# Patient Record
Sex: Female | Born: 1975 | Race: White | Hispanic: No | Marital: Married | Smoking: Never smoker
Health system: Southern US, Community
[De-identification: ages and names within clinical notes are randomized; demographics above are authoritative.]

## PROBLEM LIST (undated history)

## (undated) DIAGNOSIS — E079 Disorder of thyroid, unspecified: Secondary | ICD-10-CM

## (undated) DIAGNOSIS — T7840XA Allergy, unspecified, initial encounter: Secondary | ICD-10-CM

## (undated) DIAGNOSIS — C801 Malignant (primary) neoplasm, unspecified: Secondary | ICD-10-CM

## (undated) HISTORY — PX: KNEE ARTHROSCOPY: SHX127

## (undated) HISTORY — DX: Allergy, unspecified, initial encounter: T78.40XA

## (undated) HISTORY — PX: WISDOM TOOTH EXTRACTION: SHX21

## (undated) HISTORY — PX: DILATION AND CURETTAGE OF UTERUS: SHX78

## (undated) HISTORY — DX: Disorder of thyroid, unspecified: E07.9

---

## 2005-04-14 ENCOUNTER — Ambulatory Visit: Payer: Self-pay | Admitting: Obstetrics and Gynecology

## 2006-04-27 ENCOUNTER — Inpatient Hospital Stay: Payer: Self-pay

## 2008-09-02 ENCOUNTER — Inpatient Hospital Stay: Payer: Self-pay | Admitting: Obstetrics and Gynecology

## 2009-03-06 ENCOUNTER — Inpatient Hospital Stay: Payer: Self-pay | Admitting: Obstetrics and Gynecology

## 2014-06-09 HISTORY — PX: MELANOMA EXCISION: SHX5266

## 2014-10-11 HISTORY — PX: INTRAUTERINE DEVICE (IUD) INSERTION: SHX5877

## 2015-09-09 ENCOUNTER — Encounter: Payer: Self-pay | Admitting: Unknown Physician Specialty

## 2015-09-09 ENCOUNTER — Ambulatory Visit (INDEPENDENT_AMBULATORY_CARE_PROVIDER_SITE_OTHER): Payer: BC Managed Care – PPO | Admitting: Unknown Physician Specialty

## 2015-09-09 VITALS — BP 118/82 | HR 65 | Temp 98.6°F | Ht 66.2 in | Wt 119.6 lb

## 2015-09-09 DIAGNOSIS — Z0000 Encounter for general adult medical examination without abnormal findings: Secondary | ICD-10-CM | POA: Diagnosis not present

## 2015-09-09 DIAGNOSIS — J312 Chronic pharyngitis: Secondary | ICD-10-CM

## 2015-09-09 NOTE — Progress Notes (Signed)
   BP 118/82 mmHg  Pulse 65  Temp(Src) 98.6 F (37 C)  Ht 5' 6.2" (1.681 m)  Wt 119 lb 9.6 oz (54.25 kg)  BMI 19.20 kg/m2  SpO2 99%  LMP  (LMP Unknown)   Subjective:    Patient ID: Tonya Estrada, female    DOB: 03-Jun-1976, 39 y.o.   MRN: [id number]  HPI: Tonya Estrada is a 39 y.o. female  Chief Complaint  Patient presents with  . Establish Care    pt is re-establishing care   Pt originally made the appointment for pain in her throat.  States it is better.  States it is on the right side and is there for about 2 months.  She is up to date on pap smears.    Relevant past medical, surgical, family and social history reviewed and updated as indicated. Interim medical history since our last visit reviewed. Allergies and medications reviewed and updated.  Review of Systems  Per HPI unless specifically indicated above     Objective:    BP 118/82 mmHg  Pulse 65  Temp(Src) 98.6 F (37 C)  Ht 5' 6.2" (1.681 m)  Wt 119 lb 9.6 oz (54.25 kg)  BMI 19.20 kg/m2  SpO2 99%  LMP  (LMP Unknown)  Wt Readings from Last 3 Encounters:  09/09/15 119 lb 9.6 oz (54.25 kg)  02/18/11 118 lb (53.524 kg)    Physical Exam  Constitutional: She is oriented to person, place, and time. She appears well-developed and well-nourished.  HENT:  Head: Normocephalic and atraumatic.  Eyes: Pupils are equal, round, and reactive to light. Right eye exhibits no discharge. Left eye exhibits no discharge. No scleral icterus.  Neck: Normal range of motion. Neck supple. Carotid bruit is not present. No thyromegaly present.  Cardiovascular: Normal rate, regular rhythm and normal heart sounds.  Exam reveals no gallop and no friction rub.   No murmur heard. Pulmonary/Chest: Effort normal and breath sounds normal. No respiratory distress. She has no wheezes. She has no rales.  Abdominal: Soft. Bowel sounds are normal. There is no tenderness. There is no rebound.  Genitourinary: No breast swelling, tenderness  or discharge.  Musculoskeletal: Normal range of motion.  Lymphadenopathy:    She has no cervical adenopathy.  Neurological: She is alert and oriented to person, place, and time.  Skin: Skin is warm, dry and intact. No rash noted.  Psychiatric: She has a normal mood and affect. Her speech is normal and behavior is normal. Judgment and thought content normal. Cognition and memory are normal.    No results found for this or any previous visit.    Assessment & Plan:   Problem List Items Addressed This Visit    None    Visit Diagnoses    Routine general medical examination at a health care facility    -  Primary    Relevant Medications    Multiple Vitamin (MULTIVITAMIN) tablet    Other Relevant Orders    HIV antibody    Comprehensive metabolic panel    CBC with Differential/Platelet    TSH    Lipid Panel w/o Chol/HDL Ratio    Flu Vaccine QUAD 36+ mos IM    Chronic sore throat        Relevant Orders    Ambulatory referral to ENT        Follow up plan: Return for lab results.

## 2015-09-10 ENCOUNTER — Encounter: Payer: Self-pay | Admitting: Unknown Physician Specialty

## 2015-09-10 LAB — COMPREHENSIVE METABOLIC PANEL
A/G RATIO: 1.6 (ref 1.1–2.5)
ALBUMIN: 4.7 g/dL (ref 3.5–5.5)
ALT: 18 IU/L (ref 0–32)
AST: 19 IU/L (ref 0–40)
Alkaline Phosphatase: 56 IU/L (ref 39–117)
BILIRUBIN TOTAL: 0.3 mg/dL (ref 0.0–1.2)
BUN / CREAT RATIO: 31 — AB (ref 8–20)
BUN: 19 mg/dL (ref 6–20)
CHLORIDE: 100 mmol/L (ref 97–106)
CO2: 22 mmol/L (ref 18–29)
Calcium: 9.4 mg/dL (ref 8.7–10.2)
Creatinine, Ser: 0.62 mg/dL (ref 0.57–1.00)
GFR calc non Af Amer: 115 mL/min/{1.73_m2} (ref >59)
GFR, EST AFRICAN AMERICAN: 132 mL/min/{1.73_m2} (ref >59)
Globulin, Total: 2.9 g/dL (ref 1.5–4.5)
Glucose: 89 mg/dL (ref 65–99)
Potassium: 4.3 mmol/L (ref 3.5–5.2)
Sodium: 140 mmol/L (ref 136–144)
TOTAL PROTEIN: 7.6 g/dL (ref 6.0–8.5)

## 2015-09-10 LAB — LIPID PANEL W/O CHOL/HDL RATIO
CHOLESTEROL TOTAL: 140 mg/dL (ref 100–199)
HDL: 52 mg/dL (ref >39)
LDL Calculated: 72 mg/dL (ref 0–99)
TRIGLYCERIDES: 82 mg/dL (ref 0–149)
VLDL Cholesterol Cal: 16 mg/dL (ref 5–40)

## 2015-09-10 LAB — CBC WITH DIFFERENTIAL/PLATELET
Basophils Absolute: 0 10*3/uL (ref 0.0–0.2)
Basos: 1 %
EOS (ABSOLUTE): 0.1 10*3/uL (ref 0.0–0.4)
EOS: 2 %
Hematocrit: 38.2 % (ref 34.0–46.6)
Hemoglobin: 12.6 g/dL (ref 11.1–15.9)
IMMATURE GRANS (ABS): 0 10*3/uL (ref 0.0–0.1)
IMMATURE GRANULOCYTES: 0 %
LYMPHS: 21 %
Lymphocytes Absolute: 1.5 10*3/uL (ref 0.7–3.1)
MCH: 29.5 pg (ref 26.6–33.0)
MCHC: 33 g/dL (ref 31.5–35.7)
MCV: 90 fL (ref 79–97)
MONOS ABS: 0.5 10*3/uL (ref 0.1–0.9)
Monocytes: 7 %
Neutrophils Absolute: 4.9 10*3/uL (ref 1.4–7.0)
Neutrophils: 69 %
PLATELETS: 236 10*3/uL (ref 150–379)
RBC: 4.27 x10E6/uL (ref 3.77–5.28)
RDW: 12.8 % (ref 12.3–15.4)
WBC: 7.1 10*3/uL (ref 3.4–10.8)

## 2015-09-10 LAB — HIV ANTIBODY (ROUTINE TESTING W REFLEX): HIV Screen 4th Generation wRfx: NONREACTIVE

## 2015-09-10 LAB — TSH: TSH: 1.29 u[IU]/mL (ref 0.450–4.500)

## 2015-09-10 NOTE — Progress Notes (Signed)
Quick Note:  Normal labs. Patient notified by letter. ______ 

## 2017-01-12 ENCOUNTER — Encounter: Payer: BC Managed Care – PPO | Admitting: Unknown Physician Specialty

## 2017-01-13 ENCOUNTER — Encounter: Payer: BC Managed Care – PPO | Admitting: Unknown Physician Specialty

## 2017-02-08 ENCOUNTER — Encounter: Payer: Self-pay | Admitting: Unknown Physician Specialty

## 2017-02-08 ENCOUNTER — Ambulatory Visit (INDEPENDENT_AMBULATORY_CARE_PROVIDER_SITE_OTHER): Payer: BC Managed Care – PPO | Admitting: Unknown Physician Specialty

## 2017-02-08 VITALS — BP 117/80 | HR 67 | Temp 98.3°F | Ht 66.3 in | Wt 120.7 lb

## 2017-02-08 DIAGNOSIS — Z0000 Encounter for general adult medical examination without abnormal findings: Secondary | ICD-10-CM | POA: Diagnosis not present

## 2017-02-08 NOTE — Progress Notes (Signed)
BP 117/80 (BP Location: Left Arm, Patient Position: Sitting, Cuff Size: Small)   Pulse 67   Temp 98.3 F (36.8 C)   Ht 5' 6.3" (1.684 m)   Wt 120 lb 11.2 oz (54.7 kg)   SpO2 99%   BMI 19.31 kg/m    Subjective:    Patient ID: Tonya Estrada, female    DOB: Oct 13, 1976, 41 y.o.   MRN: [id number]  HPI: Tonya Estrada is a 41 y.o. female  Chief Complaint  Patient presents with  . Annual Exam    pt states she goes to OBGYN for pap smears   Social History   Social History  . Marital status: Married    Spouse name: N/A  . Number of children: N/A  . Years of education: N/A   Occupational History  . Not on file.   Social History Main Topics  . Smoking status: Never Smoker  . Smokeless tobacco: Never Used  . Alcohol use 1.2 oz/week    2 Glasses of wine per week  . Drug use: No  . Sexual activity: Yes    Birth control/ protection: IUD   Other Topics Concern  . Not on file   Social History Narrative  . No narrative on file   Family History  Problem Relation Age of Onset  . Arthritis Mother     RA  . Multiple myeloma Maternal Grandmother   . Heart disease Maternal Grandmother     MI  . Diabetes Maternal Grandfather     type 1  . Bipolar disorder Paternal Grandmother     thinks bipolar came from head injuries  . Cancer Paternal Grandfather     brain   Past Medical History:  Diagnosis Date  . Allergy    Past Surgical History:  Procedure Laterality Date  . KNEE ARTHROSCOPY      Relevant past medical, surgical, family and social history reviewed and updated as indicated. Interim medical history since our last visit reviewed. Allergies and medications reviewed and updated.  Review of Systems  Per HPI unless specifically indicated above     Objective:    BP 117/80 (BP Location: Left Arm, Patient Position: Sitting, Cuff Size: Small)   Pulse 67   Temp 98.3 F (36.8 C)   Ht 5' 6.3" (1.684 m)   Wt 120 lb 11.2 oz (54.7 kg)   SpO2 99%   BMI 19.31  kg/m   Wt Readings from Last 3 Encounters:  02/08/17 120 lb 11.2 oz (54.7 kg)  09/09/15 119 lb 9.6 oz (54.3 kg)  02/18/11 118 lb (53.5 kg)    Physical Exam  Constitutional: She is oriented to person, place, and time. She appears well-developed and well-nourished.  HENT:  Head: Normocephalic and atraumatic.  Eyes: Pupils are equal, round, and reactive to light. Right eye exhibits no discharge. Left eye exhibits no discharge. No scleral icterus.  Neck: Normal range of motion. Neck supple. Carotid bruit is not present. No thyromegaly present.  Cardiovascular: Normal rate, regular rhythm and normal heart sounds.  Exam reveals no gallop and no friction rub.   No murmur heard. Pulmonary/Chest: Effort normal and breath sounds normal. No respiratory distress. She has no wheezes. She has no rales.  Abdominal: Soft. Bowel sounds are normal. There is no tenderness. There is no rebound.  Genitourinary: No breast swelling, tenderness or discharge.  Musculoskeletal: Normal range of motion.  Lymphadenopathy:    She has no cervical adenopathy.  Neurological: She is alert and oriented  to person, place, and time.  Skin: Skin is warm, dry and intact. No rash noted.  Psychiatric: She has a normal mood and affect. Her speech is normal and behavior is normal. Judgment and thought content normal. Cognition and memory are normal.    Results for orders placed or performed in visit on 09/09/15  HIV antibody  Result Value Ref Range   HIV Screen 4th Generation wRfx Non Reactive Non Reactive  Comprehensive metabolic panel  Result Value Ref Range   Glucose 89 65 - 99 mg/dL   BUN 19 6 - 20 mg/dL   Creatinine, Ser 0.62 0.57 - 1.00 mg/dL   GFR calc non Af Amer 115 >59 mL/min/1.73   GFR calc Af Amer 132 >59 mL/min/1.73   BUN/Creatinine Ratio 31 (H) 8 - 20   Sodium 140 136 - 144 mmol/L   Potassium 4.3 3.5 - 5.2 mmol/L   Chloride 100 97 - 106 mmol/L   CO2 22 18 - 29 mmol/L   Calcium 9.4 8.7 - 10.2 mg/dL    Total Protein 7.6 6.0 - 8.5 g/dL   Albumin 4.7 3.5 - 5.5 g/dL   Globulin, Total 2.9 1.5 - 4.5 g/dL   Albumin/Globulin Ratio 1.6 1.1 - 2.5   Bilirubin Total 0.3 0.0 - 1.2 mg/dL   Alkaline Phosphatase 56 39 - 117 IU/L   AST 19 0 - 40 IU/L   ALT 18 0 - 32 IU/L  CBC with Differential/Platelet  Result Value Ref Range   WBC 7.1 3.4 - 10.8 x10E3/uL   RBC 4.27 3.77 - 5.28 x10E6/uL   Hemoglobin 12.6 11.1 - 15.9 g/dL   Hematocrit 38.2 34.0 - 46.6 %   MCV 90 79 - 97 fL   MCH 29.5 26.6 - 33.0 pg   MCHC 33.0 31.5 - 35.7 g/dL   RDW 12.8 12.3 - 15.4 %   Platelets 236 150 - 379 x10E3/uL   Neutrophils 69 %   Lymphs 21 %   Monocytes 7 %   Eos 2 %   Basos 1 %   Neutrophils Absolute 4.9 1.4 - 7.0 x10E3/uL   Lymphocytes Absolute 1.5 0.7 - 3.1 x10E3/uL   Monocytes Absolute 0.5 0.1 - 0.9 x10E3/uL   EOS (ABSOLUTE) 0.1 0.0 - 0.4 x10E3/uL   Basophils Absolute 0.0 0.0 - 0.2 x10E3/uL   Immature Granulocytes 0 %   Immature Grans (Abs) 0.0 0.0 - 0.1 x10E3/uL  TSH  Result Value Ref Range   TSH 1.290 0.450 - 4.500 uIU/mL  Lipid Panel w/o Chol/HDL Ratio  Result Value Ref Range   Cholesterol, Total 140 100 - 199 mg/dL   Triglycerides 82 0 - 149 mg/dL   HDL 52 >39 mg/dL   VLDL Cholesterol Cal 16 5 - 40 mg/dL   LDL Calculated 72 0 - 99 mg/dL      Assessment & Plan:   Problem List Items Addressed This Visit    None    Visit Diagnoses    Annual physical exam    -  Primary      No labs with excellent labs last visit Follow up plan: Return in about 1 year (around 02/08/2018), or if symptoms worsen or fail to improve.

## 2017-05-24 ENCOUNTER — Encounter: Payer: Self-pay | Admitting: Unknown Physician Specialty

## 2017-05-24 ENCOUNTER — Ambulatory Visit (INDEPENDENT_AMBULATORY_CARE_PROVIDER_SITE_OTHER): Payer: BC Managed Care – PPO | Admitting: Unknown Physician Specialty

## 2017-05-24 DIAGNOSIS — L249 Irritant contact dermatitis, unspecified cause: Secondary | ICD-10-CM

## 2017-05-24 DIAGNOSIS — L259 Unspecified contact dermatitis, unspecified cause: Secondary | ICD-10-CM | POA: Insufficient documentation

## 2017-05-24 MED ORDER — MOMETASONE FUROATE 0.1 % EX CREA: 1.0000 "application " | TOPICAL_CREAM | Freq: Every day | CUTANEOUS | 0 refills | Status: DC

## 2017-05-24 MED ORDER — PREDNISONE 10 MG PO TABS: ORAL_TABLET | ORAL | 0 refills | Status: DC

## 2017-05-24 NOTE — Progress Notes (Signed)
BP 128/89   Pulse 64   Temp 99 F (37.2 C)   Wt 122 lb 9.6 oz (55.6 kg)   SpO2 99%   BMI 19.61 kg/m    Subjective:    Patient ID: Tonya Estrada, female    DOB: 1976-11-08, 41 y.o.   MRN: [id number]  HPI: Tonya Estrada is a 41 y.o. female  Chief Complaint  Patient presents with  . Rash    pt states she has a rash just on her face and ears that came up Thursday, states it does itch   Rash Pt with a rash that started on Thursday.  It itches.. Does not know what she came in contact with. No new soaps or creams.  No SOB.  Tends to be sensitive to plans  Relevant past medical, surgical, family and social history reviewed and updated as indicated. Interim medical history since our last visit reviewed. Allergies and medications reviewed and updated.  Review of Systems  Per HPI unless specifically indicated above     Objective:    BP 128/89   Pulse 64   Temp 99 F (37.2 C)   Wt 122 lb 9.6 oz (55.6 kg)   SpO2 99%   BMI 19.61 kg/m   Wt Readings from Last 3 Encounters:  05/24/17 122 lb 9.6 oz (55.6 kg)  02/08/17 120 lb 11.2 oz (54.7 kg)  09/09/15 119 lb 9.6 oz (54.3 kg)    Physical Exam  Constitutional: She is oriented to person, place, and time. She appears well-developed and well-nourished. No distress.  HENT:  Head: Normocephalic and atraumatic.  Eyes: Conjunctivae and lids are normal. Right eye exhibits no discharge. Left eye exhibits no discharge. No scleral icterus.  Neck: Normal range of motion. Neck supple. No JVD present. Carotid bruit is not present.  Pulmonary/Chest: Effort normal and breath sounds normal.  Abdominal: Normal appearance. There is no splenomegaly or hepatomegaly.  Musculoskeletal: Normal range of motion.  Neurological: She is alert and oriented to person, place, and time.  Skin: Skin is warm, dry and intact. No pallor.  Pt with a erythemetous papular rash orver face and ears  Psychiatric: She has a normal mood and affect. Her behavior  is normal. Judgment and thought content normal.    Results for orders placed or performed in visit on 09/09/15  HIV antibody  Result Value Ref Range   HIV Screen 4th Generation wRfx Non Reactive Non Reactive  Comprehensive metabolic panel  Result Value Ref Range   Glucose 89 65 - 99 mg/dL   BUN 19 6 - 20 mg/dL   Creatinine, Ser 0.62 0.57 - 1.00 mg/dL   GFR calc non Af Amer 115 >59 mL/min/1.73   GFR calc Af Amer 132 >59 mL/min/1.73   BUN/Creatinine Ratio 31 (H) 8 - 20   Sodium 140 136 - 144 mmol/L   Potassium 4.3 3.5 - 5.2 mmol/L   Chloride 100 97 - 106 mmol/L   CO2 22 18 - 29 mmol/L   Calcium 9.4 8.7 - 10.2 mg/dL   Total Protein 7.6 6.0 - 8.5 g/dL   Albumin 4.7 3.5 - 5.5 g/dL   Globulin, Total 2.9 1.5 - 4.5 g/dL   Albumin/Globulin Ratio 1.6 1.1 - 2.5   Bilirubin Total 0.3 0.0 - 1.2 mg/dL   Alkaline Phosphatase 56 39 - 117 IU/L   AST 19 0 - 40 IU/L   ALT 18 0 - 32 IU/L  CBC with Differential/Platelet  Result Value Ref Range  WBC 7.1 3.4 - 10.8 x10E3/uL   RBC 4.27 3.77 - 5.28 x10E6/uL   Hemoglobin 12.6 11.1 - 15.9 g/dL   Hematocrit 38.2 34.0 - 46.6 %   MCV 90 79 - 97 fL   MCH 29.5 26.6 - 33.0 pg   MCHC 33.0 31.5 - 35.7 g/dL   RDW 12.8 12.3 - 15.4 %   Platelets 236 150 - 379 x10E3/uL   Neutrophils 69 %   Lymphs 21 %   Monocytes 7 %   Eos 2 %   Basos 1 %   Neutrophils Absolute 4.9 1.4 - 7.0 x10E3/uL   Lymphocytes Absolute 1.5 0.7 - 3.1 x10E3/uL   Monocytes Absolute 0.5 0.1 - 0.9 x10E3/uL   EOS (ABSOLUTE) 0.1 0.0 - 0.4 x10E3/uL   Basophils Absolute 0.0 0.0 - 0.2 x10E3/uL   Immature Granulocytes 0 %   Immature Grans (Abs) 0.0 0.0 - 0.1 x10E3/uL  TSH  Result Value Ref Range   TSH 1.290 0.450 - 4.500 uIU/mL  Lipid Panel w/o Chol/HDL Ratio  Result Value Ref Range   Cholesterol, Total 140 100 - 199 mg/dL   Triglycerides 82 0 - 149 mg/dL   HDL 52 >39 mg/dL   VLDL Cholesterol Cal 16 5 - 40 mg/dL   LDL Calculated 72 0 - 99 mg/dL      Assessment & Plan:   Problem  List Items Addressed This Visit      Unprioritized   Contact dermatitis      Rx of Prednisone taper and Elocon cream  BID prn  Follow up plan: Return if symptoms worsen or fail to improve.

## 2017-08-11 ENCOUNTER — Ambulatory Visit: Payer: Self-pay | Admitting: Certified Nurse Midwife

## 2017-08-25 ENCOUNTER — Ambulatory Visit: Payer: Self-pay | Admitting: Certified Nurse Midwife

## 2017-08-30 ENCOUNTER — Ambulatory Visit (INDEPENDENT_AMBULATORY_CARE_PROVIDER_SITE_OTHER): Payer: BC Managed Care – PPO | Admitting: Certified Nurse Midwife

## 2017-08-30 ENCOUNTER — Encounter: Payer: Self-pay | Admitting: Certified Nurse Midwife

## 2017-08-30 VITALS — BP 102/62 | HR 83 | Ht 66.0 in | Wt 124.0 lb

## 2017-08-30 DIAGNOSIS — Z01419 Encounter for gynecological examination (general) (routine) without abnormal findings: Secondary | ICD-10-CM

## 2017-08-30 DIAGNOSIS — Z1231 Encounter for screening mammogram for malignant neoplasm of breast: Secondary | ICD-10-CM

## 2017-08-30 DIAGNOSIS — Z1239 Encounter for other screening for malignant neoplasm of breast: Secondary | ICD-10-CM

## 2017-08-30 NOTE — Progress Notes (Signed)
Gynecology Annual Exam  PCP: Kathrine Haddock, NP  Chief Complaint:  Chief Complaint  Patient presents with  . Gynecologic Exam    History of Present Illness: Tonya Estrada is a 41 y.o. G3 P2012 who presents for her annual gyn exam. The patient has no complaints today.  Her menses are absent due to the Mirena IUD. She does have occasional spotting.  Last pap smear: 07/11/2015, results were NIL/neg   The patient is sexually active. She currently uses Mirena for contraception. Her Mirena was inserted 10/11/2014. She has dyspareunia occasionally which is positional.  Since her last visit, she has had no significant changes in her health.  Her past medical history is remarkable for eczema/ allergies and melanoma on her leg.  The patient does not perform self breast exams. Her last mammogram was NA.  There is a family history of breast cancer in her [place].  Genetic testing is not indicated.   There is no family history of ovarian cancer.   The patient denies smoking.  She reports drinking alcohol. She reports have 4-5 drinks per week.  She denies illegal drug use.  The patient reports exercising regularly, until school started back in Sept. She is a runner and was running 10 miles a week.  She had a cholesterol check done in 2016 by her PCP and that was normal.   The patient denies current symptoms of depression.    Review of Systems: Review of Systems  Constitutional: Negative for chills, fever and weight loss.  HENT: Negative for congestion, sinus pain and sore throat.   Eyes: Negative for blurred vision and pain.  Respiratory: Negative for hemoptysis, shortness of breath and wheezing.   Cardiovascular: Negative for chest pain, palpitations and leg swelling.  Gastrointestinal: Negative for abdominal pain, blood in stool, diarrhea, heartburn, nausea and vomiting.  Genitourinary: Negative for dysuria, frequency, hematuria and urgency.  Musculoskeletal: Negative for  back pain, joint pain and myalgias.  Skin: Negative for itching and rash.  Neurological: Negative for dizziness, tingling and headaches.  Endo/Heme/Allergies: Positive for environmental allergies. Negative for polydipsia. Does not bruise/bleed easily.       Negative for hirsutism   Psychiatric/Behavioral: Negative for depression. The patient is not nervous/anxious and does not have insomnia.     Past Medical History:  Past Medical History:  Diagnosis Date  . Allergy     Past Surgical History:  Past Surgical History:  Procedure Laterality Date  . DILATION AND CURETTAGE OF UTERUS    . INTRAUTERINE DEVICE (IUD) INSERTION  10/11/2014  . KNEE ARTHROSCOPY    . MELANOMA EXCISION  06/2014   both legs  . WISDOM TOOTH EXTRACTION      Family History:  Family History  Problem Relation Age of Onset  . Basal cell carcinoma Father 59  . Rheum arthritis Mother   . Multiple myeloma Maternal Grandmother   . Heart disease Maternal Grandmother        MI  . Hypertension Maternal Grandmother   . Diabetes Maternal Grandfather        type 1  . Heart disease Maternal Grandfather   . Cancer Paternal Grandfather 51       brain  . Breast cancer Other 63    Social History:  Social History   Social History  . Marital status: Married    Spouse name: N/A  . Number of children: 2  . Years of education: N/A   Occupational History  . Teacher  Social History Main Topics  . Smoking status: Never Smoker  . Smokeless tobacco: Never Used  . Alcohol use 3.0 oz/week    5 Glasses of wine per week  . Drug use: No  . Sexual activity: Yes    Partners: Male    Birth control/ protection: IUD   Other Topics Concern  . Not on file   Social History Narrative  . No narrative on file    Allergies:  Allergies  Allergen Reactions  . Hydrocodone Hives     Current Outpatient Prescriptions:  .  levonorgestrel (MIRENA) 20 MCG/24HR IUD, 1 each by Intrauterine route once., Disp: , Rfl:  .   mometasone (ELOCON) 0.1 % cream, Apply 1 application topically daily., Disp: 45 g, Rfl: 0 .  Multiple Vitamin (MULTIVITAMIN) tablet, Take 1 tablet by mouth daily., Disp: , Rfl:  .  AFLURIA QUADRIVALENT injection, , Disp: , Rfl:  Medications: Physical Exam Vitals: BP 102/62   Pulse 83   Ht 5' 6"  (1.676 m)   Wt 124 lb (56.2 kg)   BMI 20.01 kg/m   General: WF in NAD HEENT: normocephalic, anicteric Neck: no thyroid enlargement, no palpable nodules, no cervical lymphadenopathy  Pulmonary: No increased work of breathing, CTAB Cardiovascular: RRR, without murmur  Breast: Breast symmetrical, no tenderness, no palpable nodules or masses, no skin or nipple retraction present, no nipple discharge.  No axillary, infraclavicular or supraclavicular lymphadenopathy. Abdomen: Soft, non-tender, non-distended.  Umbilicus without lesions.  No hepatomegaly or masses palpable. No evidence of hernia. Genitourinary:  External: Normal external female genitalia.  Normal urethral meatus, normal Bartholin's and Skene's glands.    Vagina: Normal vaginal mucosa, no evidence of prolapse.    Cervix: Grossly normal in appearance, no bleeding, posterior, non-tender, IUD string seen at cervical os  Uterus: Anteverted, normal size, shape, and consistency, mobile, and non-tender, and deviated to the left.  Adnexa: No adnexal masses, non-tender  Rectal: deferred  Lymphatic: no evidence of inguinal lymphadenopathy Extremities: no edema, erythema, or tenderness Neurologic: Grossly intact Psychiatric: mood appropriate, affect full     Assessment: 41 y.o. G3 P2012 with normal annual gyn exam.  Plan:   1) Breast cancer screening - recommend monthly self breast exam and annual screening mammograms. Mammogram was ordered today. Patient to schedule appointment  2) Cervical cancer screening - Pap smear due in 1 years. ASCCP guidelines and rational discussed.  Patient opts for every 3 years screening interval  3)  Contraception - Mirena IUD expires in 2020  4) Routine healthcare maintenance including cholesterol and diabetes screening managed by PCP and is UTD.  5) RTO in 1 year and prn.  Dalia Heading, CNM

## 2017-09-22 ENCOUNTER — Ambulatory Visit: Payer: Self-pay

## 2017-10-04 ENCOUNTER — Ambulatory Visit
Admission: RE | Admit: 2017-10-04 | Discharge: 2017-10-04 | Disposition: A | Discharge status: Home / Self Care | Payer: BC Managed Care – PPO | Source: Ambulatory Visit | Attending: Certified Nurse Midwife | Admitting: Certified Nurse Midwife

## 2017-10-04 DIAGNOSIS — Z1231 Encounter for screening mammogram for malignant neoplasm of breast: Secondary | ICD-10-CM | POA: Diagnosis not present

## 2017-10-04 DIAGNOSIS — Z1239 Encounter for other screening for malignant neoplasm of breast: Secondary | ICD-10-CM

## 2017-10-04 HISTORY — DX: Malignant (primary) neoplasm, unspecified: C80.1

## 2017-10-21 ENCOUNTER — Other Ambulatory Visit: Payer: Self-pay | Admitting: *Deleted

## 2017-10-21 ENCOUNTER — Inpatient Hospital Stay
Admission: RE | Admit: 2017-10-21 | Discharge: 2017-10-21 | Disposition: A | Discharge status: Home / Self Care | Payer: Self-pay | Source: Ambulatory Visit | Attending: *Deleted | Admitting: *Deleted

## 2017-10-21 DIAGNOSIS — Z9289 Personal history of other medical treatment: Secondary | ICD-10-CM

## 2018-01-03 ENCOUNTER — Ambulatory Visit (INDEPENDENT_AMBULATORY_CARE_PROVIDER_SITE_OTHER): Payer: BC Managed Care – PPO | Admitting: Certified Nurse Midwife

## 2018-01-03 ENCOUNTER — Encounter: Payer: Self-pay | Admitting: Certified Nurse Midwife

## 2018-01-03 VITALS — BP 104/64 | HR 68 | Ht 66.0 in | Wt 123.0 lb

## 2018-01-03 DIAGNOSIS — I808 Phlebitis and thrombophlebitis of other sites: Secondary | ICD-10-CM | POA: Diagnosis not present

## 2018-01-03 DIAGNOSIS — C439 Malignant melanoma of skin, unspecified: Secondary | ICD-10-CM | POA: Insufficient documentation

## 2018-01-03 DIAGNOSIS — Z85820 Personal history of malignant melanoma of skin: Secondary | ICD-10-CM | POA: Insufficient documentation

## 2018-01-03 NOTE — Progress Notes (Signed)
Obstetrics & Gynecology Office Visit   Chief Complaint:  Chief Complaint  Patient presents with  . Breast Problem    left breast pain x 1 week    History of Present Illness: 42 year old WF G3 P2012 presents with complaints of bilateral breast pain (left>right) since last week. Right breast pain has resolved, but she feels a tender vein in her left breast in the upper outer quadrant. Denies any trauma, bug bites to the breast or left arm, but did have a bout of poison oak on her left arm. Last exam at time of annual 08/2017. Last mammogram 10/04/2017 was negative. Current form of contraception: Mirena IUD. Had a negative pregnancy test at home.  [facility]-breast cancer.   Review of Systems:  Review of Systems  Constitutional: Negative for chills and fever.  Skin: Positive for rash (poison [place]).   Breast: tenderness bilaterally   Past Medical History:  Past Medical History:  Diagnosis Date  . Allergy   . Cancer ([place])    skin ca    Past Surgical History:  Past Surgical History:  Procedure Laterality Date  . DILATION AND CURETTAGE OF UTERUS    . INTRAUTERINE DEVICE (IUD) INSERTION  10/11/2014  . KNEE ARTHROSCOPY    . MELANOMA EXCISION  06/2014   both legs  . WISDOM TOOTH EXTRACTION      Gynecologic History: No LMP recorded. Patient is not currently having periods (Reason: IUD).  Obstetric History: [id number]  Family History:  Family History  Problem Relation Age of Onset  . Basal cell carcinoma Father 60  . Rheum arthritis Mother   . Multiple myeloma Maternal Grandmother   . Heart disease Maternal Grandmother        MI  . Hypertension Maternal Grandmother   . Diabetes Maternal Grandfather        type 1  . Heart disease Maternal Grandfather   . Cancer Paternal Grandfather 81       brain  . Breast cancer Other 74    Social History:  Social History   Socioeconomic History  . Marital status: Married    Spouse name: Not on file  . Number of children: 2  . Years of  education: Not on file  . Highest education level: Not on file  Social Needs  . Financial resource strain: Not on file  . Food insecurity - worry: Not on file  . Food insecurity - inability: Not on file  . Transportation needs - medical: Not on file  . Transportation needs - non-medical: Not on file  Occupational History  . Occupation: Pharmacist, hospital  Tobacco Use  . Smoking status: Never Smoker  . Smokeless tobacco: Never Used  Substance and Sexual Activity  . Alcohol use: Yes    Alcohol/week: 3.0 oz    Types: 5 Glasses of wine per week  . Drug use: No  . Sexual activity: Yes    Partners: Male    Birth control/protection: IUD  Other Topics Concern  . Not on file  Social History Narrative  . Not on file    Allergies:  Allergies  Allergen Reactions  . Hydrocodone Hives    Medications: Prior to Admission medications   Medication Sig Start Date End Date Taking? Authorizing Provider  levonorgestrel (MIRENA) 20 MCG/24HR IUD 1 each by Intrauterine route once.    [provider]  Multiple Vitamin (MULTIVITAMIN) tablet Take 1 tablet by mouth daily.    [provider]    Physical Exam Vitals:BP 104/64  Pulse 68   Ht 5' 6"  (1.676 m)   Wt 123 lb (55.8 kg)   BMI 19.85 kg/m  No LMP recorded. Patient is not currently having periods (Reason: IUD).  Physical Exam  Constitutional: She is oriented to person, place, and time. She appears well-developed and well-nourished.  Respiratory: Effort normal.    Left breast: Thickened tender cord at 2 o'clock, no other masses palpated. No skin or nipple changes. Right breast: soft , no masses, no skin or nipple changes. No axillary lymphadenopathy bilaterally.  Neurological: She is alert and oriented to person, place, and time.  Skin: Skin is warm and dry. Rash (papular rash left forearm (poison oak)) noted.  Psychiatric: She has a normal mood and affect.     Assessment: 42 y.o. [id number] with superficial thrombophlebitis in  left breast  Plan: Recommend oral antiinflammatories and warm soaks. Usually this is self limited and resolves spontaneously in 4-6 weeks. Patient to let me know if not resolved in 6 weeks and will refer to general surgeon for further evaluation.  Dalia Heading, CNM

## 2018-04-29 ENCOUNTER — Ambulatory Visit: Payer: BC Managed Care – PPO | Admitting: Physician Assistant

## 2018-04-29 ENCOUNTER — Encounter: Payer: Self-pay | Admitting: Physician Assistant

## 2018-04-29 VITALS — BP 119/85 | HR 97 | Temp 98.5°F | Wt 124.0 lb

## 2018-04-29 DIAGNOSIS — B9789 Other viral agents as the cause of diseases classified elsewhere: Secondary | ICD-10-CM | POA: Diagnosis not present

## 2018-04-29 DIAGNOSIS — J069 Acute upper respiratory infection, unspecified: Secondary | ICD-10-CM

## 2018-04-29 NOTE — Patient Instructions (Signed)
[facility]    Viral Respiratory Infection A viral respiratory infection is an illness that affects parts of the body used for breathing, like the lungs, nose, and throat. It is caused by a germ called a virus. Some examples of this kind of infection are:  A cold.  The flu (influenza).  A respiratory syncytial virus (RSV) infection.  How do I know if I have this infection? Most of the time this infection causes:  A stuffy or runny nose.  Yellow or green fluid in the nose.  A cough.  Sneezing.  Tiredness (fatigue).  Achy muscles.  A sore throat.  Sweating or chills.  A fever.  A headache.  How is this infection treated? If the flu is diagnosed early, it may be treated with an antiviral medicine. This medicine shortens the length of time a person has symptoms. Symptoms may be treated with over-the-counter and prescription medicines, such as:  Expectorants. These make it easier to cough up mucus.  Decongestant nasal sprays.  Doctors do not prescribe antibiotic medicines for viral infections. They do not work with this kind of infection. How do I know if I should stay home? To keep others from getting sick, stay home if you have:  A fever.  A lasting cough.  A sore throat.  A runny nose.  Sneezing.  Muscles aches.  Headaches.  Tiredness.  Weakness.  Chills.  Sweating.  An upset stomach (nausea).  Follow these instructions at home:  Rest as much as possible.  Take over-the-counter and prescription medicines only as told by your doctor.  Drink enough fluid to keep your pee (urine) clear or pale yellow.  Gargle with salt water. Do this 3-4 times per day or as needed. To make a salt-water mixture, dissolve -1 tsp of salt in 1 cup of warm water. Make sure the salt dissolves all the way.  Use nose drops made from salt water. This helps with stuffiness (congestion). It also helps soften the skin around your nose.  Do not drink alcohol.  Do not  use tobacco products, including cigarettes, chewing tobacco, and e-cigarettes. If you need help quitting, ask your doctor. Get help if:  Your symptoms last for 10 days or longer.  Your symptoms get worse over time.  You have a fever.  You have very bad pain in your face or forehead.  Parts of your jaw or neck become very swollen. Get help right away if:  You feel pain or pressure in your chest.  You have shortness of breath.  You faint or feel like you will faint.  You keep throwing up (vomiting).  You feel confused. This information is not intended to replace advice given to you by your health care provider. Make sure you discuss any questions you have with your health care provider. Document Released: 10/08/2008 Document Revised: 04/02/2016 Document Reviewed: 04/03/2015 Elsevier Interactive Patient Education  2018 [employer].

## 2018-05-13 NOTE — Progress Notes (Signed)
Subjective:    Patient ID: Tonya Estrada, female    DOB: 01-Jan-1976, 42 y.o.   MRN: [id number]  Tonya Estrada is a 42 y.o. female presenting on 04/29/2018 for URI (x 4 days, head congestion and fever 100.0*)   HPI   Itchy, water eyes, runny nose, stuffy sinuses. Small temperature.   Social History   Tobacco Use  . Smoking status: Never Smoker  . Smokeless tobacco: Never Used  Substance Use Topics  . Alcohol use: Yes    Alcohol/week: 3.0 oz    Types: 5 Glasses of wine per week  . Drug use: No    Review of Systems Per HPI unless specifically indicated above     Objective:    BP 119/85   Pulse 97   Temp 98.5 F (36.9 C) (Oral)   Wt 124 lb (56.2 kg)   SpO2 98%   BMI 20.01 kg/m   Wt Readings from Last 3 Encounters:  04/29/18 124 lb (56.2 kg)  01/03/18 123 lb (55.8 kg)  08/30/17 124 lb (56.2 kg)    Physical Exam  Constitutional: She appears well-developed and well-nourished.  HENT:  Right Ear: External ear normal.  Left Ear: External ear normal.  Mouth/Throat: Oropharynx is clear and moist. No oropharyngeal exudate.  Eyes: Right eye exhibits discharge. Left eye exhibits discharge.  Neck: Neck supple.  Cardiovascular: Normal rate and regular rhythm.  Pulmonary/Chest: Effort normal and breath sounds normal. No respiratory distress. She has no rales.  Lymphadenopathy:    She has no cervical adenopathy.  Skin: Skin is warm and dry.  Psychiatric: She has a normal mood and affect. Her behavior is normal.   Results for orders placed or performed in visit on 09/09/15  HIV antibody  Result Value Ref Range   HIV Screen 4th Generation wRfx Non Reactive Non Reactive  Comprehensive metabolic panel  Result Value Ref Range   Glucose 89 65 - 99 mg/dL   BUN 19 6 - 20 mg/dL   Creatinine, Ser 0.62 0.57 - 1.00 mg/dL   GFR calc non Af Amer 115 >59 mL/min/1.73   GFR calc Af Amer 132 >59 mL/min/1.73   BUN/Creatinine Ratio 31 (H) 8 - 20   Sodium 140 136 - 144 mmol/L   Potassium 4.3 3.5 - 5.2 mmol/L   Chloride 100 97 - 106 mmol/L   CO2 22 18 - 29 mmol/L   Calcium 9.4 8.7 - 10.2 mg/dL   Total Protein 7.6 6.0 - 8.5 g/dL   Albumin 4.7 3.5 - 5.5 g/dL   Globulin, Total 2.9 1.5 - 4.5 g/dL   Albumin/Globulin Ratio 1.6 1.1 - 2.5   Bilirubin Total 0.3 0.0 - 1.2 mg/dL   Alkaline Phosphatase 56 39 - 117 IU/L   AST 19 0 - 40 IU/L   ALT 18 0 - 32 IU/L  CBC with Differential/Platelet  Result Value Ref Range   WBC 7.1 3.4 - 10.8 x10E3/uL   RBC 4.27 3.77 - 5.28 x10E6/uL   Hemoglobin 12.6 11.1 - 15.9 g/dL   Hematocrit 38.2 34.0 - 46.6 %   MCV 90 79 - 97 fL   MCH 29.5 26.6 - 33.0 pg   MCHC 33.0 31.5 - 35.7 g/dL   RDW 12.8 12.3 - 15.4 %   Platelets 236 150 - 379 x10E3/uL   Neutrophils 69 %   Lymphs 21 %   Monocytes 7 %   Eos 2 %   Basos 1 %   Neutrophils Absolute 4.9 1.4 -  7.0 x10E3/uL   Lymphocytes Absolute 1.5 0.7 - 3.1 x10E3/uL   Monocytes Absolute 0.5 0.1 - 0.9 x10E3/uL   EOS (ABSOLUTE) 0.1 0.0 - 0.4 x10E3/uL   Basophils Absolute 0.0 0.0 - 0.2 x10E3/uL   Immature Granulocytes 0 %   Immature Grans (Abs) 0.0 0.0 - 0.1 x10E3/uL  TSH  Result Value Ref Range   TSH 1.290 0.450 - 4.500 uIU/mL  Lipid Panel w/o Chol/HDL Ratio  Result Value Ref Range   Cholesterol, Total 140 100 - 199 mg/dL   Triglycerides 82 0 - 149 mg/dL   HDL 52 >39 mg/dL   VLDL Cholesterol Cal 16 5 - 40 mg/dL   LDL Calculated 72 0 - 99 mg/dL      Assessment & Plan:   1. Viral URI with cough   Follow up plan: Return if symptoms worsen or fail to improve.  Carles Collet, PA-C [facility] 05/13/2018, 1:45 PM

## 2018-08-30 NOTE — Progress Notes (Signed)
[id number]    Gynecology Annual Exam  PCP: Wicker, Cheryl, NP  Chief Complaint:  Chief Complaint  Patient presents with  . Gynecologic Exam    History of Present Illness: Tonya Estrada is a 42 y.o. G3 P2012 who presents for her annual gyn exam. The patient has no complaints today.  Her menses are absent due to the Mirena IUD. She does have  spotting every month or two, lasting a day and occasionally accompanied by cramping. Her LMP 2 weeks ago. Last pap smear: 07/11/2015, results were NIL/neg   The patient is sexually active. She currently uses Mirena for contraception. Her Mirena was inserted 10/11/2014.   Since her last visit, she has had no significant changes in her health.  Her past medical history is remarkable for eczema/ allergies and melanoma on her leg. She sees a dermatologist annually. The patient does perform self breast exams. Her last mammogram was 10/04/2018 and results were negative, density c.  There is a family history of breast cancer in her [facility].  Genetic testing is not indicated.   There is no family history of ovarian cancer.   The patient denies smoking.  She reports drinking alcohol. She reports have 3-4 glasses of wine per week.  She denies illegal drug use.  The patient reports exercising regularly, until school started back in Sept. Currently runs 30-40 minutes, 2 times a week.  She had a cholesterol check done in 2016 by her PCP and that was normal.   The patient denies current symptoms of depression.    Review of Systems: Review of Systems  Constitutional: Negative for chills, fever and weight loss.  HENT: Negative for congestion, sinus pain and sore throat.   Eyes: Negative for blurred vision and pain.  Respiratory: Negative for hemoptysis, shortness of breath and wheezing.   Cardiovascular: Negative for chest pain, palpitations and leg swelling.  Gastrointestinal: Negative for abdominal pain, blood in stool, diarrhea, heartburn, nausea and  vomiting.  Genitourinary: Negative for dysuria, frequency, hematuria and urgency.  Musculoskeletal: Negative for back pain, joint pain and myalgias.  Skin: Negative for itching and rash.  Neurological: Negative for dizziness, tingling and headaches.  Endo/Heme/Allergies: Positive for environmental allergies. Negative for polydipsia. Does not bruise/bleed easily.       Negative for hirsutism   Psychiatric/Behavioral: Negative for depression. The patient is not nervous/anxious and does not have insomnia.     Past Medical History:  Past Medical History:  Diagnosis Date  . Allergy   . Cancer (HCC)    melanoma    Past Surgical History:  Past Surgical History:  Procedure Laterality Date  . DILATION AND CURETTAGE OF UTERUS    . INTRAUTERINE DEVICE (IUD) INSERTION  10/11/2014  . KNEE ARTHROSCOPY    . MELANOMA EXCISION  06/2014   both legs  . WISDOM TOOTH EXTRACTION      Family History:  Family History  Problem Relation Age of Onset  . Basal cell carcinoma Father 45  . Rheum arthritis Mother   . Multiple myeloma Maternal Grandmother   . Heart disease Maternal Grandmother        MI  . Hypertension Maternal Grandmother   . Diabetes Maternal Grandfather        type 1  . Heart disease Maternal Grandfather   . Cancer Paternal Grandfather 38       brain  . Breast cancer Other 70    Social History:  Social History   Socioeconomic History  . Marital status: Married      Spouse name: Not on file  . Number of children: 2  . Years of education: Not on file  . Highest education level: Not on file  Occupational History  . Occupation: Teacher  Social Needs  . Financial resource strain: Not on file  . Food insecurity:    Worry: Not on file    Inability: Not on file  . Transportation needs:    Medical: Not on file    Non-medical: Not on file  Tobacco Use  . Smoking status: Never Smoker  . Smokeless tobacco: Never Used  Substance and Sexual Activity  . Alcohol use: Yes     Alcohol/week: 5.0 standard drinks    Types: 5 Glasses of wine per week  . Drug use: No  . Sexual activity: Yes    Partners: Male    Birth control/protection: IUD  Lifestyle  . Physical activity:    Days per week: Not on file    Minutes per session: Not on file  . Stress: Not on file  Relationships  . Social connections:    Talks on phone: Not on file    Gets together: Not on file    Attends religious service: Not on file    Active member of club or organization: Not on file    Attends meetings of clubs or organizations: Not on file    Relationship status: Not on file  . Intimate partner violence:    Fear of current or ex partner: Not on file    Emotionally abused: Not on file    Physically abused: Not on file    Forced sexual activity: Not on file  Other Topics Concern  . Not on file  Social History Narrative  . Not on file    Allergies:  Allergies  Allergen Reactions  . Hydrocodone Hives     Current Outpatient Medications:  .  levonorgestrel (MIRENA) 20 MCG/24HR IUD, 1 each by Intrauterine route once., Disp: , Rfl:  .  Multiple Vitamin (MULTIVITAMIN) tablet, Take 1 tablet by mouth daily., Disp: , Rfl:  Medications: Physical Exam Vitals: BP 100/60   Pulse 66   Ht 5' 6" (1.676 m)   Wt 126 lb (57.2 kg)   LMP 08/17/2018 (Approximate)   BMI 20.34 kg/m   General: WF in NAD HEENT: normocephalic, anicteric Neck: no thyroid enlargement, no palpable nodules, no cervical lymphadenopathy  Pulmonary: No increased work of breathing, CTAB Cardiovascular: RRR, without murmur  Breast: Breast symmetrical, no tenderness, no palpable nodules or masses, no skin or nipple retraction present, no nipple discharge.  No axillary, infraclavicular or supraclavicular lymphadenopathy. Abdomen: Soft, non-tender, non-distended.  Umbilicus without lesions.  No hepatomegaly or masses palpable. No evidence of hernia. Genitourinary:  External: Normal external female genitalia.  Normal urethral  meatus, normal Bartholin's and Skene's glands.    Vagina: Normal vaginal mucosa, no evidence of prolapse.    Cervix: Grossly normal in appearance, no bleeding, posterior, non-tender, IUD strings not visible  Uterus: Anteverted, normal size, shape, and consistency, mobile, and non-tender, and deviated to the left.  Adnexa: No adnexal masses, non-tender  Rectal: deferred  Lymphatic: no evidence of inguinal lymphadenopathy Extremities: no edema, erythema, or tenderness Neurologic: Grossly intact Psychiatric: mood appropriate, affect full     Assessment: 42 y.o. G3 P2012 with normal annual gyn exam except missing IUD strings  Plan:   1) Breast cancer screening - recommend monthly self breast exam and annual screening mammograms. Mammogram was ordered today. Patient to schedule appointment for after 10/04/2018    2) Cervical cancer screening - Pap smear done. ASCCP guidelines and rational discussed.  Patient opts for every 3 years screening interval  3) Contraception - Missing IUD strings-will get TVUS today  4) Routine healthcare maintenance including cholesterol and diabetes screening managed by PCP and is UTD.  5) RTO in 1 year and prn.  Dalia Heading, CNM  Addendum: TVUS was done and IUD appears to  in correct position. IUD strings appear to be in lower uterine segment. Discussed findings with patient and implications regarding removal of the IUD. IUD expires 10/2019  Dalia Heading, CNM

## 2018-08-31 ENCOUNTER — Ambulatory Visit (INDEPENDENT_AMBULATORY_CARE_PROVIDER_SITE_OTHER): Payer: BC Managed Care – PPO

## 2018-08-31 ENCOUNTER — Encounter: Payer: Self-pay | Admitting: Certified Nurse Midwife

## 2018-08-31 ENCOUNTER — Other Ambulatory Visit (HOSPITAL_COMMUNITY)
Admission: RE | Admit: 2018-08-31 | Discharge: 2018-08-31 | Disposition: A | Discharge status: Home / Self Care | Payer: BC Managed Care – PPO | Source: Ambulatory Visit | Attending: Certified Nurse Midwife | Admitting: Certified Nurse Midwife

## 2018-08-31 ENCOUNTER — Ambulatory Visit (INDEPENDENT_AMBULATORY_CARE_PROVIDER_SITE_OTHER): Payer: BC Managed Care – PPO | Admitting: Certified Nurse Midwife

## 2018-08-31 VITALS — BP 100/60 | HR 66 | Ht 66.0 in | Wt 126.0 lb

## 2018-08-31 DIAGNOSIS — Z01419 Encounter for gynecological examination (general) (routine) without abnormal findings: Secondary | ICD-10-CM

## 2018-08-31 DIAGNOSIS — Z975 Presence of (intrauterine) contraceptive device: Secondary | ICD-10-CM

## 2018-08-31 DIAGNOSIS — Z124 Encounter for screening for malignant neoplasm of cervix: Secondary | ICD-10-CM

## 2018-08-31 DIAGNOSIS — Z1239 Encounter for other screening for malignant neoplasm of breast: Secondary | ICD-10-CM

## 2018-08-31 DIAGNOSIS — T8332XA Displacement of intrauterine contraceptive device, initial encounter: Secondary | ICD-10-CM

## 2018-08-31 DIAGNOSIS — Z30431 Encounter for routine checking of intrauterine contraceptive device: Secondary | ICD-10-CM

## 2018-09-02 LAB — CYTOLOGY - PAP
Diagnosis: NEGATIVE
HPV: NOT DETECTED

## 2018-09-08 ENCOUNTER — Other Ambulatory Visit: Payer: Self-pay | Admitting: Obstetrics & Gynecology

## 2018-09-08 NOTE — Progress Notes (Signed)
ULTRASOUND REPORT Location: [facility]  Date of Service: 08/31/2018   Indications:Missing IUD strings Findings:  The uterus is anteverted Echo texture is homogenous without evidence of focal masses. The IUD is in the endometrial cavity and the arms of the IUD are in the fundus;  the strings are seen in the lower uterine segment.  Survey of the adnexa demonstrates no adnexal masses. There is no free fluid in the cul de sac.  Impression: 1. IUD is in place  Recommendations: 1.Clinical correlation with the patient's History and Physical Exam.  Dalia Heading, CNM  Review of ULTRASOUND.    I have personally reviewed images and report of recent ultrasound done at [facility].    Plan of management to be discussed with CNM Danise Mina.  Barnett Applebaum, MD, [facility], [facility] 09/08/2018  3:22 PM

## 2018-10-12 ENCOUNTER — Ambulatory Visit
Admission: RE | Admit: 2018-10-12 | Discharge: 2018-10-12 | Disposition: A | Discharge status: Home / Self Care | Payer: BC Managed Care – PPO | Source: Ambulatory Visit | Attending: Certified Nurse Midwife | Admitting: Certified Nurse Midwife

## 2018-10-12 ENCOUNTER — Other Ambulatory Visit: Payer: Self-pay | Admitting: Certified Nurse Midwife

## 2018-10-12 DIAGNOSIS — R921 Mammographic calcification found on diagnostic imaging of breast: Secondary | ICD-10-CM

## 2018-10-12 DIAGNOSIS — Z1239 Encounter for other screening for malignant neoplasm of breast: Secondary | ICD-10-CM | POA: Insufficient documentation

## 2018-10-12 DIAGNOSIS — R928 Other abnormal and inconclusive findings on diagnostic imaging of breast: Secondary | ICD-10-CM

## 2018-10-17 ENCOUNTER — Ambulatory Visit: Payer: BC Managed Care – PPO | Admitting: Nurse Practitioner

## 2018-10-18 ENCOUNTER — Encounter: Payer: Self-pay | Admitting: Nurse Practitioner

## 2018-10-18 ENCOUNTER — Ambulatory Visit: Payer: BC Managed Care – PPO | Admitting: Nurse Practitioner

## 2018-10-18 VITALS — BP 117/77 | HR 80 | Temp 98.9°F | Ht 66.06 in | Wt 124.5 lb

## 2018-10-18 DIAGNOSIS — R002 Palpitations: Secondary | ICD-10-CM | POA: Diagnosis not present

## 2018-10-18 DIAGNOSIS — Z0000 Encounter for general adult medical examination without abnormal findings: Secondary | ICD-10-CM | POA: Diagnosis not present

## 2018-10-18 NOTE — Assessment & Plan Note (Addendum)
Ongoing since college years with no change in frequency and asymptomatic during episodes. EKG today noted NSR with normal axis.  Return in 3 months with journal documenting triggers and will discuss further.  Return if worsening symptoms.

## 2018-10-18 NOTE — Patient Instructions (Signed)
Palpitations A palpitation is the feeling that your heart:  Has an uneven (irregular) heartbeat.  Is beating faster than normal.  Is fluttering.  Is skipping a beat.  This is usually not a serious problem. In some cases, you may need more medical tests. Follow these instructions at home:  Avoid: ? Caffeine in coffee, tea, soft drinks, diet pills, and energy drinks. ? Chocolate. ? Alcohol.  Do not use any tobacco products. These include cigarettes, chewing tobacco, and e-cigarettes. If you need help quitting, ask your doctor.  Try to reduce your stress. These things may help: ? Yoga. ? Meditation. ? Physical activity. Swimming, jogging, and walking are good choices. ? A method that helps you use your mind to control things in your body, like heartbeats (biofeedback).  Get plenty of rest and sleep.  Take over-the-counter and prescription medicines only as told by your doctor.  Keep all follow-up visits as told by your doctor. This is important. Contact a doctor if:  Your heartbeat is still fast or uneven after 24 hours.  Your palpitations occur more often. Get help right away if:  You have chest pain.  You feel short of breath.  You have a very bad headache.  You feel dizzy.  You pass out (faint). This information is not intended to replace advice given to you by your health care provider. Make sure you discuss any questions you have with your health care provider. Document Released: 08/04/2008 Document Revised: 04/02/2016 Document Reviewed: 07/11/2015 Elsevier Interactive Patient Education  2018 [employer]  

## 2018-10-18 NOTE — Progress Notes (Signed)
BP 117/77 (BP Location: Left Arm, Patient Position: Sitting, Cuff Size: Normal)   Pulse 80   Temp 98.9 F (37.2 C)   Ht 5' 6.06" (1.678 m)   Wt 124 lb 8 oz (56.5 kg)   SpO2 97%   BMI 20.06 kg/m    Subjective:    Patient ID: Tonya Estrada, female    DOB: [birth year], 42 y.o.   MRN: [id number]  HPI: Tonya Estrada is a 42 y.o. female presents for annual exam.  Her last pap was 08/31/18 performed by Jaclyn Shaggy CNM at [facility], also had breast exam at this visit. Pap was negative for HPV and lesions/malignancy.  She has IUD in place.  Had mammogram 10/12/18 which noted calcifications right breast and diagnostic mammogram has been recommended.  Chief Complaint  Patient presents with  . Annual Exam    HEART FLUTTERS: She reports she has had these for several years.  Have been present since high school or college.  States both of her sisters have similar issues, states they have "arrythmias".  She reports her sisters for not taking anything for this and have not had surgeries.  Reports that the "flutters" happen once or twice a month, lasting less than a minute when present.  Denies any discomfort, just "notices heart fluttering".  Denies SOB, CP, headaches, dizziness with episodes.  They have not become more frequent since her younger years, no changes.  She has not tracked a pattern.  Have encouraged her to monitor and note if any foods/drinks or hormonal shifts are present during periods when "flutters" are noted.  She is agreeable to having EKG performed today.  Denies noting any heat flutters today.  Depression screen [facility] 2/9 10/18/2018 04/29/2018 02/08/2017  Decreased Interest 0 0 0  Down, Depressed, Hopeless 0 0 0  PHQ - 2 Score 0 0 0  Altered sleeping 0 - -  Tired, decreased energy 0 - -  Change in appetite 0 - -  Feeling bad or failure about yourself  0 - -  Trouble concentrating 0 - -  Moving slowly or fidgety/restless 0 - -  Suicidal thoughts 0 - -  PHQ-9 Score 0 - -    Difficult doing work/chores Not difficult at all - -   GAD 7 : Generalized Anxiety Score 10/18/2018  Nervous, Anxious, on Edge 0  Control/stop worrying 0  Worry too much - different things 0  Trouble relaxing 0  Restless 0  Easily annoyed or irritable 0  Afraid - awful might happen 0  Total GAD 7 Score 0   Functional Status Survey: Is the patient deaf or have difficulty hearing?: No Does the patient have difficulty seeing, even when wearing glasses/contacts?: No Does the patient have difficulty concentrating, remembering, or making decisions?: No Does the patient have difficulty walking or climbing stairs?: No Does the patient have difficulty dressing or bathing?: No Does the patient have difficulty doing errands alone such as visiting a doctor's office or shopping?: No  Social History   Socioeconomic History  . Marital status: Married    Spouse name: Not on file  . Number of children: 2  . Years of education: Not on file  . Highest education level: Not on file  Occupational History  . Occupation: Pharmacist, hospital  Social Needs  . Financial resource strain: Not on file  . Food insecurity:    Worry: Not on file    Inability: Not on file  . Transportation needs:    Medical: Not  on file    Non-medical: Not on file  Tobacco Use  . Smoking status: Never Smoker  . Smokeless tobacco: Never Used  Substance and Sexual Activity  . Alcohol use: Yes    Alcohol/week: 5.0 standard drinks    Types: 5 Glasses of wine per week  . Drug use: No  . Sexual activity: Yes    Partners: Male    Birth control/protection: IUD  Lifestyle  . Physical activity:    Days per week: Not on file    Minutes per session: Not on file  . Stress: Not on file  Relationships  . Social connections:    Talks on phone: Not on file    Gets together: Not on file    Attends religious service: Not on file    Active member of club or organization: Not on file    Attends meetings of clubs or organizations: Not on  file    Relationship status: Not on file  . Intimate partner violence:    Fear of current or ex partner: Not on file    Emotionally abused: Not on file    Physically abused: Not on file    Forced sexual activity: Not on file  Other Topics Concern  . Not on file  Social History Narrative  . Not on file   Relevant past medical, surgical, family and social history reviewed and updated as indicated. Interim medical history since our last visit reviewed. Allergies and medications reviewed and updated.  Review of Systems  Constitutional: Negative for activity change, appetite change, fatigue, fever and unexpected weight change.  HENT: Negative.   Eyes: Negative.   Respiratory: Negative for cough, chest tightness, shortness of breath and wheezing.   Cardiovascular: Positive for palpitations (has had for years, none noted today). Negative for chest pain and leg swelling.  Gastrointestinal: Negative for abdominal distention, abdominal pain, constipation, diarrhea, nausea and vomiting.  Endocrine: Negative.   Genitourinary: Negative.   Musculoskeletal: Negative.   Skin: Negative.   Allergic/Immunologic: Negative.   Neurological: Negative for dizziness, numbness and headaches.  Hematological: Negative.   Psychiatric/Behavioral: Negative.     Per HPI unless specifically indicated above     Objective:    BP 117/77 (BP Location: Left Arm, Patient Position: Sitting, Cuff Size: Normal)   Pulse 80   Temp 98.9 F (37.2 C)   Ht 5' 6.06" (1.678 m)   Wt 124 lb 8 oz (56.5 kg)   SpO2 97%   BMI 20.06 kg/m   Wt Readings from Last 3 Encounters:  10/18/18 124 lb 8 oz (56.5 kg)  08/31/18 126 lb (57.2 kg)  04/29/18 124 lb (56.2 kg)    Physical Exam  Constitutional: She is oriented to person, place, and time. She appears well-developed and well-nourished.  HENT:  Head: Normocephalic and atraumatic.  Right Ear: Hearing, tympanic membrane, external ear and ear canal normal.  Left Ear:  Hearing, tympanic membrane, external ear and ear canal normal.  Nose: Nose normal. Right sinus exhibits no maxillary sinus tenderness and no frontal sinus tenderness. Left sinus exhibits no maxillary sinus tenderness and no frontal sinus tenderness.  Mouth/Throat: Oropharynx is clear and moist.  Eyes: Pupils are equal, round, and reactive to light. Conjunctivae and EOM are normal. Right eye exhibits no discharge. Left eye exhibits no discharge.  Neck: Normal range of motion. Neck supple. No JVD present. Carotid bruit is not present. No thyromegaly present.  Cardiovascular: Normal rate, regular rhythm, S1 normal, S2 normal, normal heart sounds  and intact distal pulses. Exam reveals no gallop.  No murmur heard. Pulmonary/Chest: Effort normal and breath sounds normal.  Abdominal: Soft. Bowel sounds are normal. There is no splenomegaly or hepatomegaly.  Musculoskeletal: Normal range of motion.  Lymphadenopathy:    She has no cervical adenopathy.  Neurological: She is alert and oriented to person, place, and time. She has normal reflexes.  Reflex Scores:      Brachioradialis reflexes are 2+ on the right side and 2+ on the left side.      Patellar reflexes are 2+ on the right side and 2+ on the left side. Skin: Skin is warm and dry.  Psychiatric: She has a normal mood and affect. Her behavior is normal.  Nursing note and vitals reviewed.  EKG WITH NSR AND NORMAL AXIS NOTED.    Results for orders placed or performed in visit on 08/31/18  Cytology - PAP  Result Value Ref Range   Adequacy      Satisfactory for evaluation  endocervical/transformation zone component PRESENT.   Diagnosis      NEGATIVE FOR INTRAEPITHELIAL LESIONS OR MALIGNANCY.   HPV NOT DETECTED    Material Submitted CervicoVaginal Pap [ThinPrep Imaged]       Assessment & Plan:   Problem List Items Addressed This Visit      Other   Heart palpitations    Ongoing since college years with no change in frequency and  asymptomatic during episodes. EKG today noted NSR with normal axis.  Return in 3 months with journal documenting triggers and will discuss further.  Return if worsening symptoms.       Other Visit Diagnoses    Annual physical exam    -  Primary   Annual labs performed today.  CBC, CMP, TSH, lipid panel   Relevant Orders   CBC with Differential/Platelet   Comprehensive metabolic panel   TSH   Lipid Panel w/o Chol/HDL Ratio   EKG 12-Lead (Completed)       Follow up plan: Return in about 3 months (around 01/17/2019) for heart palpitations.

## 2018-10-19 LAB — COMPREHENSIVE METABOLIC PANEL
ALBUMIN: 4.6 g/dL (ref 3.5–5.5)
ALT: 15 IU/L (ref 0–32)
AST: 17 IU/L (ref 0–40)
Albumin/Globulin Ratio: 1.8 (ref 1.2–2.2)
Alkaline Phosphatase: 51 IU/L (ref 39–117)
BUN/Creatinine Ratio: 19 (ref 9–23)
BUN: 13 mg/dL (ref 6–24)
Bilirubin Total: 0.5 mg/dL (ref 0.0–1.2)
CO2: 23 mmol/L (ref 20–29)
CREATININE: 0.69 mg/dL (ref 0.57–1.00)
Calcium: 9.7 mg/dL (ref 8.7–10.2)
Chloride: 103 mmol/L (ref 96–106)
GFR calc non Af Amer: 108 mL/min/{1.73_m2} (ref >59)
GFR, EST AFRICAN AMERICAN: 125 mL/min/{1.73_m2} (ref >59)
Globulin, Total: 2.5 g/dL (ref 1.5–4.5)
Glucose: 61 mg/dL — ABNORMAL LOW (ref 65–99)
POTASSIUM: 4.5 mmol/L (ref 3.5–5.2)
Sodium: 141 mmol/L (ref 134–144)
Total Protein: 7.1 g/dL (ref 6.0–8.5)

## 2018-10-19 LAB — CBC WITH DIFFERENTIAL/PLATELET
BASOS ABS: 0 10*3/uL (ref 0.0–0.2)
Basos: 1 %
EOS (ABSOLUTE): 0.1 10*3/uL (ref 0.0–0.4)
EOS: 1 %
HEMATOCRIT: 39.6 % (ref 34.0–46.6)
HEMOGLOBIN: 13.5 g/dL (ref 11.1–15.9)
Immature Grans (Abs): 0 10*3/uL (ref 0.0–0.1)
Immature Granulocytes: 0 %
Lymphocytes Absolute: 0.9 10*3/uL (ref 0.7–3.1)
Lymphs: 18 %
MCH: 30.6 pg (ref 26.6–33.0)
MCHC: 34.1 g/dL (ref 31.5–35.7)
MCV: 90 fL (ref 79–97)
MONOS ABS: 0.5 10*3/uL (ref 0.1–0.9)
Monocytes: 9 %
NEUTROS ABS: 3.7 10*3/uL (ref 1.4–7.0)
Neutrophils: 71 %
Platelets: 213 10*3/uL (ref 150–450)
RBC: 4.41 x10E6/uL (ref 3.77–5.28)
RDW: 12.2 % — ABNORMAL LOW (ref 12.3–15.4)
WBC: 5.2 10*3/uL (ref 3.4–10.8)

## 2018-10-19 LAB — TSH: TSH: 1.04 u[IU]/mL (ref 0.450–4.500)

## 2018-10-19 LAB — LIPID PANEL W/O CHOL/HDL RATIO
Cholesterol, Total: 151 mg/dL (ref 100–199)
HDL: 58 mg/dL (ref >39)
LDL Calculated: 80 mg/dL (ref 0–99)
Triglycerides: 66 mg/dL (ref 0–149)
VLDL Cholesterol Cal: 13 mg/dL (ref 5–40)

## 2018-10-25 ENCOUNTER — Ambulatory Visit
Admission: RE | Admit: 2018-10-25 | Discharge: 2018-10-25 | Disposition: A | Discharge status: Home / Self Care | Payer: BC Managed Care – PPO | Source: Ambulatory Visit | Attending: Certified Nurse Midwife | Admitting: Certified Nurse Midwife

## 2018-10-25 DIAGNOSIS — R928 Other abnormal and inconclusive findings on diagnostic imaging of breast: Secondary | ICD-10-CM

## 2018-10-25 DIAGNOSIS — R921 Mammographic calcification found on diagnostic imaging of breast: Secondary | ICD-10-CM

## 2019-01-17 ENCOUNTER — Ambulatory Visit: Payer: BC Managed Care – PPO | Admitting: Nurse Practitioner

## 2019-01-20 ENCOUNTER — Other Ambulatory Visit: Payer: Self-pay

## 2019-01-20 ENCOUNTER — Ambulatory Visit: Payer: BC Managed Care – PPO | Admitting: Nurse Practitioner

## 2019-01-20 ENCOUNTER — Encounter: Payer: Self-pay | Admitting: Nurse Practitioner

## 2019-01-20 DIAGNOSIS — R002 Palpitations: Secondary | ICD-10-CM

## 2019-01-20 NOTE — Patient Instructions (Signed)
Premature Ventricular Contraction    A premature ventricular contraction (PVC) is a common kind of irregular heartbeat (arrhythmia). These contractions are extra heartbeats that start in the ventricles of the heart and occur too early in the normal sequence. During the PVC, the heart's normal electrical pathway is not used, so the beat is shorter and less effective. In most cases, these contractions come and go and do not require treatment.  What are the causes?  Common causes of the condition include:   Smoking.   Drinking alcohol.   Certain medicines.   Some illegal drugs.   Stress.   Caffeine.  Certain medical conditions can also cause PVCs:   Heart failure.   Heart attack, or coronary artery disease.   Heart valve problems.   Changes in minerals in the blood (electrolytes).   Low blood oxygen levels or high carbon dioxide levels.  In many cases, the cause of this condition is not known.  What are the signs or symptoms?  The main symptom of this condition is fast or skipped heartbeats (palpitations). Other symptoms include:   Chest pain.   Shortness of breath.   Feeling tired.   Dizziness.   Difficulty exercising.  In some cases, there are no symptoms.  How is this diagnosed?  This condition may be diagnosed based on:   Your medical history.   A physical exam. During the exam, the health care provider will check for irregular heartbeats.   Tests, such as:  ? An ECG (electrocardiogram) to monitor the electrical activity of your heart.  ? Ambulatory cardiac monitor. This device records your heartbeats for 24 hours or more.  ? Stress tests to see how exercise affects your heart rhythm and blood supply.  ? Echocardiogram. This test uses sound waves (ultrasound) to produce an image of your heart.  ? Electrophysiology study (EPS). This test checks for electrical problems in your heart.  How is this treated?  Treatment for this condition depends on any underlying conditions, the type of PVCs that you  are having, and how much the symptoms are interfering with your daily life.  Possible treatments include:   Avoiding things that cause premature contractions (triggers). These include caffeine and alcohol.   Taking medicines if symptoms are severe or if the extra heartbeats are frequent.   Getting treatment for underlying conditions that cause PVCs.   Having an implantable cardioverter defibrillator (ICD), if you are at risk for a serious arrhythmia. The ICD is a small device that is inserted into your chest to monitor your heartbeat. When it senses an irregular heartbeat, it sends a shock to bring the heartbeat back to normal.   Having a procedure to destroy the portion of the heart tissue that sends out abnormal signals (catheter ablation).  In some cases, no treatment is required.  Follow these instructions at home:  Alcohol use     Do not drink alcohol if:  ? Your health care provider tells you not to drink.  ? You are pregnant, may be pregnant, or are planning to become pregnant.  ? Alcohol triggers your episodes.   If you drink alcohol, limit how much you have. You may drink:  ? 0-1 drink a day for women.  ? 0-2 drinks a day for men.   Be aware of how much alcohol is in your drink. In the [place] one drink equals one typical bottle of beer (12 oz), one-half glass of wine (5 oz), or one shot of hard liquor (1   oz).  General instructions   Do not use any products that contain nicotine or tobacco, such as cigarettes and e-cigarettes. If you need help quitting, ask your health care provider.   Find healthy ways to manage stress. Avoid stressful situations when possible.   Exercise regularly. Ask your health care provider what type of exercise is safe for you.   Try to get at least 7-9 hours of sleep each night, or as much as recommended by your health care provider.   If caffeine triggers episodes of PVC, do not eat, drink, or use anything with caffeine in it.   Do not use illegal drugs.   Take  over-the-counter and prescription medicines only as told by your health care provider.   Keep all follow-up visits as told by your health care provider. This is important.  Contact a health care provider if you:   Feel palpitations.  Get help right away if you:   Have chest pain.   Have shortness of breath.   Have sweating for no reason.   Have nausea and vomiting.   Become light-headed or you faint.  Summary   A premature ventricular contraction (PVC) is a common kind of irregular heartbeat (arrhythmia).   In most cases, these contractions come and go and do not require treatment.   You may need to wear an ambulatory cardiac monitor. This records your heartbeats for 24 hours or more.   Treatment depends on any underlying conditions, the type of PVCs that you are having, and how much the symptoms are interfering with your daily life.  This information is not intended to replace advice given to you by your health care provider. Make sure you discuss any questions you have with your health care provider.  Document Released: 06/12/2004 Document Revised: 06/10/2018 Document Reviewed: 12/14/2017  Elsevier Interactive Patient Education  2019 [employer]

## 2019-01-20 NOTE — Assessment & Plan Note (Signed)
No changes, asymptomatic.  Wishes not to have cardiology referral at this time.  Will obtain if increased episodes or other symptoms present.  Is aware to schedule appointment if any changes present. 

## 2019-01-20 NOTE — Progress Notes (Signed)
BP 123/86   Pulse 79   Ht 5\' 6"  (1.676 m)   Wt 124 lb (56.2 kg)   SpO2 99%   BMI 20.01 kg/m    Subjective:    Patient ID: Tonya Estrada, female    DOB: 1976-01-11, 43 y.o.   MRN: [id number]  HPI: Tonya Estrada is a 43 y.o. female  Chief Complaint  Patient presents with  . Follow-up    Heart palpitations - no change. All times of day. Duration 10s - 30s. Feels either a cluster of beats, or fluttering. Appears to not matter what activity she is doing.     HEART PALPITATIONS: Present for several years.  Since high school or college.  Both of her sisters have similar issues, states they have "arrythmias".  Reports her sisters for not taking anything for this and have not had surgeries.  Patient "flutters" lasting less than a minute (10-30 seconds) when present, sometimes come one at a time and sometimes in "cluster".  Reports these as once a day or twice a day, sometimes less.  Denies any discomfort, just "notices heart fluttering".  Denies SOB, CP, headaches, dizziness with episodes.  They have not become more frequent since her younger years, no changes.  She has not tracked a pattern, has been monitoring.  Have encouraged her to monitor and note if any foods/drinks or hormonal shifts are present during periods when "flutters" are noted. Sometimes if pressure applied to left side it feels uncomfortable.  Denies noting any heat flutters today.  Previous EKG at last visit was NSR.  Relevant past medical, surgical, family and social history reviewed and updated as indicated. Interim medical history since our last visit reviewed. Allergies and medications reviewed and updated.  Review of Systems  Constitutional: Negative for activity change, appetite change, diaphoresis, fatigue and fever.  Respiratory: Negative for cough, chest tightness and shortness of breath.   Cardiovascular: Negative for chest pain, palpitations and leg swelling.  Gastrointestinal: Negative for abdominal  distention, abdominal pain, constipation, diarrhea, nausea and vomiting.  Endocrine: Negative for cold intolerance, heat intolerance, polydipsia, polyphagia and polyuria.  Neurological: Negative for dizziness, syncope, weakness, light-headedness, numbness and headaches.  Psychiatric/Behavioral: Negative.     Per HPI unless specifically indicated above     Objective:    BP 123/86   Pulse 79   Ht 5\' 6"  (1.676 m)   Wt 124 lb (56.2 kg)   SpO2 99%   BMI 20.01 kg/m   Wt Readings from Last 3 Encounters:  01/20/19 124 lb (56.2 kg)  10/18/18 124 lb 8 oz (56.5 kg)  08/31/18 126 lb (57.2 kg)    Physical Exam Vitals signs and nursing note reviewed.  Constitutional:      Appearance: She is well-developed.  HENT:     Head: Normocephalic.  Eyes:     General:        Right eye: No discharge.        Left eye: No discharge.     Conjunctiva/sclera: Conjunctivae normal.     Pupils: Pupils are equal, round, and reactive to light.  Neck:     Musculoskeletal: Normal range of motion and neck supple.     Thyroid: No thyromegaly.     Vascular: No carotid bruit or JVD.  Cardiovascular:     Rate and Rhythm: Normal rate and regular rhythm.     Heart sounds: Normal heart sounds. No murmur. No gallop.   Pulmonary:     Effort: Pulmonary effort is  normal.     Breath sounds: Normal breath sounds.  Abdominal:     General: Bowel sounds are normal.     Palpations: Abdomen is soft.  Musculoskeletal:     Right lower leg: No edema.     Left lower leg: No edema.  Lymphadenopathy:     Cervical: No cervical adenopathy.  Skin:    General: Skin is warm and dry.  Neurological:     Mental Status: She is alert and oriented to person, place, and time.  Psychiatric:        Mood and Affect: Mood normal.        Behavior: Behavior normal.        Thought Content: Thought content normal.        Judgment: Judgment normal.     Results for orders placed or performed in visit on 10/18/18  CBC with  Differential/Platelet  Result Value Ref Range   WBC 5.2 3.4 - 10.8 x10E3/uL   RBC 4.41 3.77 - 5.28 x10E6/uL   Hemoglobin 13.5 11.1 - 15.9 g/dL   Hematocrit 39.6 34.0 - 46.6 %   MCV 90 79 - 97 fL   MCH 30.6 26.6 - 33.0 pg   MCHC 34.1 31.5 - 35.7 g/dL   RDW 12.2 (L) 12.3 - 15.4 %   Platelets 213 150 - 450 x10E3/uL   Neutrophils 71 Not Estab. %   Lymphs 18 Not Estab. %   Monocytes 9 Not Estab. %   Eos 1 Not Estab. %   Basos 1 Not Estab. %   Neutrophils Absolute 3.7 1.4 - 7.0 x10E3/uL   Lymphocytes Absolute 0.9 0.7 - 3.1 x10E3/uL   Monocytes Absolute 0.5 0.1 - 0.9 x10E3/uL   EOS (ABSOLUTE) 0.1 0.0 - 0.4 x10E3/uL   Basophils Absolute 0.0 0.0 - 0.2 x10E3/uL   Immature Granulocytes 0 Not Estab. %   Immature Grans (Abs) 0.0 0.0 - 0.1 x10E3/uL  Comprehensive metabolic panel  Result Value Ref Range   Glucose 61 (L) 65 - 99 mg/dL   BUN 13 6 - 24 mg/dL   Creatinine, Ser 0.69 0.57 - 1.00 mg/dL   GFR calc non Af Amer 108 >59 mL/min/1.73   GFR calc Af Amer 125 >59 mL/min/1.73   BUN/Creatinine Ratio 19 9 - 23   Sodium 141 134 - 144 mmol/L   Potassium 4.5 3.5 - 5.2 mmol/L   Chloride 103 96 - 106 mmol/L   CO2 23 20 - 29 mmol/L   Calcium 9.7 8.7 - 10.2 mg/dL   Total Protein 7.1 6.0 - 8.5 g/dL   Albumin 4.6 3.5 - 5.5 g/dL   Globulin, Total 2.5 1.5 - 4.5 g/dL   Albumin/Globulin Ratio 1.8 1.2 - 2.2   Bilirubin Total 0.5 0.0 - 1.2 mg/dL   Alkaline Phosphatase 51 39 - 117 IU/L   AST 17 0 - 40 IU/L   ALT 15 0 - 32 IU/L  TSH  Result Value Ref Range   TSH 1.040 0.450 - 4.500 uIU/mL  Lipid Panel w/o Chol/HDL Ratio  Result Value Ref Range   Cholesterol, Total 151 100 - 199 mg/dL   Triglycerides 66 0 - 149 mg/dL   HDL 58 >39 mg/dL   VLDL Cholesterol Cal 13 5 - 40 mg/dL   LDL Calculated 80 0 - 99 mg/dL      Assessment & Plan:   Problem List Items Addressed This Visit      Other   Heart palpitations    No changes, asymptomatic.  Wishes not to have cardiology referral at this time.  Will  obtain if increased episodes or other symptoms present.  Is aware to schedule appointment if any changes present.          Follow up plan: Return in about 1 year (around 01/20/2020) for annual physical.

## 2019-07-29 ENCOUNTER — Ambulatory Visit: Payer: BC Managed Care – PPO

## 2019-08-02 ENCOUNTER — Ambulatory Visit (INDEPENDENT_AMBULATORY_CARE_PROVIDER_SITE_OTHER): Payer: BC Managed Care – PPO

## 2019-08-02 ENCOUNTER — Other Ambulatory Visit: Payer: Self-pay

## 2019-08-02 DIAGNOSIS — Z23 Encounter for immunization: Secondary | ICD-10-CM | POA: Diagnosis not present

## 2019-09-06 ENCOUNTER — Ambulatory Visit: Payer: BC Managed Care – PPO | Admitting: Certified Nurse Midwife

## 2019-09-13 ENCOUNTER — Other Ambulatory Visit: Payer: Self-pay

## 2019-09-13 ENCOUNTER — Ambulatory Visit (INDEPENDENT_AMBULATORY_CARE_PROVIDER_SITE_OTHER): Payer: BC Managed Care – PPO | Admitting: Certified Nurse Midwife

## 2019-09-13 VITALS — BP 100/60 | HR 90 | Ht 66.0 in | Wt 126.0 lb

## 2019-09-13 DIAGNOSIS — Z1239 Encounter for other screening for malignant neoplasm of breast: Secondary | ICD-10-CM

## 2019-09-13 DIAGNOSIS — Z01419 Encounter for gynecological examination (general) (routine) without abnormal findings: Secondary | ICD-10-CM | POA: Diagnosis not present

## 2019-09-13 DIAGNOSIS — Z30433 Encounter for removal and reinsertion of intrauterine contraceptive device: Secondary | ICD-10-CM | POA: Diagnosis not present

## 2019-09-28 ENCOUNTER — Other Ambulatory Visit: Payer: Self-pay

## 2019-09-28 ENCOUNTER — Encounter: Payer: Self-pay | Admitting: Certified Nurse Midwife

## 2019-09-28 ENCOUNTER — Other Ambulatory Visit: Payer: Self-pay | Admitting: Certified Nurse Midwife

## 2019-09-28 DIAGNOSIS — Z1231 Encounter for screening mammogram for malignant neoplasm of breast: Secondary | ICD-10-CM

## 2019-09-28 NOTE — Progress Notes (Signed)
Gynecology Annual Exam  PCP: Venita Lick, NP  Chief Complaint:  Chief Complaint  Patient presents with  . Gynecologic Exam    History of Present Illness: Tonya Estrada is a 43 y.o. G3 P2012 who presents for her annual gyn exam. The patient has no complaints today. Is also wanting to replace her Mirena IUD.Last year there were missing strings and an ultrasound confirmed correct IUD placement.  Her menses are absent due to the Mirena IUD. She does have occasional spotting. Her LMP was about 4 weeks ago. Last pap smear: 08/31/2018, results were NIL/neg   The patient is sexually active. She currently uses Mirena for contraception. Her Mirena was inserted 10/11/2014.   Since her last visit, she has had no significant changes in her health.  Her past medical history is remarkable for eczema/ allergies and melanoma on her leg. She sees a Paediatric nurse annually. The patient does perform self breast exams. Her last mammogram was 10/12/2018 and results were Birads 1 after additional vi.  There is a family history of breast cancer in her [place].  Genetic testing is not indicated.   There is no family history of ovarian cancer.   The patient denies smoking.  She reports drinking alcohol. She reports have 3-4 glasses of wine per week.  She denies illegal drug use.  The patient reports exercising regularly, by walking, since Covid pandemic.  She had a cholesterol check done in 2019 by her PCP and that was normal.   The patient denies current symptoms of depression.    Review of Systems: Review of Systems  Constitutional: Negative for chills, fever and weight loss.  HENT: Negative for congestion, sinus pain and sore throat.   Eyes: Negative for blurred vision and pain.  Respiratory: Negative for hemoptysis, shortness of breath and wheezing.   Cardiovascular: Negative for chest pain, palpitations and leg swelling.  Gastrointestinal: Negative for abdominal pain, blood in  stool, diarrhea, heartburn, nausea and vomiting.  Genitourinary: Negative for dysuria, frequency, hematuria and urgency.  Musculoskeletal: Negative for back pain, joint pain and myalgias.  Skin: Negative for itching and rash.  Neurological: Negative for dizziness, tingling and headaches.  Endo/Heme/Allergies: Positive for environmental allergies. Negative for polydipsia. Does not bruise/bleed easily.       Negative for hirsutism   Psychiatric/Behavioral: Negative for depression. The patient is not nervous/anxious and does not have insomnia.     Past Medical History:  Past Medical History:  Diagnosis Date  . Allergy   . Cancer ([place])    melanoma    Past Surgical History:  Past Surgical History:  Procedure Laterality Date  . DILATION AND CURETTAGE OF UTERUS    . INTRAUTERINE DEVICE (IUD) INSERTION  10/11/2014  . KNEE ARTHROSCOPY    . MELANOMA EXCISION  06/2014   both legs  . WISDOM TOOTH EXTRACTION      Family History:  Family History  Problem Relation Age of Onset  . Basal cell carcinoma Father 27  . Rheum arthritis Mother   . Multiple myeloma Maternal Grandmother   . Heart disease Maternal Grandmother        MI  . Hypertension Maternal Grandmother   . Diabetes Maternal Grandfather        type 1  . Heart disease Maternal Grandfather   . Cancer Paternal Grandfather 58       brain  . Breast cancer Other 64    Social History:  Social History   Socioeconomic History  .  Marital status: Married    Spouse name: Not on file  . Number of children: 2  . Years of education: Not on file  . Highest education level: Not on file  Occupational History  . Occupation: Pharmacist, hospital  Social Needs  . Financial resource strain: Not on file  . Food insecurity    Worry: Not on file    Inability: Not on file  . Transportation needs    Medical: Not on file    Non-medical: Not on file  Tobacco Use  . Smoking status: Never Smoker  . Smokeless tobacco: Never Used  Substance and  Sexual Activity  . Alcohol use: Yes    Alcohol/week: 5.0 standard drinks    Types: 5 Glasses of wine per week  . Drug use: No  . Sexual activity: Yes    Partners: Male    Birth control/protection: I.U.D.  Lifestyle  . Physical activity    Days per week: Not on file    Minutes per session: Not on file  . Stress: Not on file  Relationships  . Social Herbalist on phone: Not on file    Gets together: Not on file    Attends religious service: Not on file    Active member of club or organization: Not on file    Attends meetings of clubs or organizations: Not on file    Relationship status: Not on file  . Intimate partner violence    Fear of current or ex partner: Not on file    Emotionally abused: Not on file    Physically abused: Not on file    Forced sexual activity: Not on file  Other Topics Concern  . Not on file  Social History Narrative  . Not on file    Allergies:  Allergies  Allergen Reactions  . Hydrocodone Hives     Current Outpatient Medications:  .  levonorgestrel (MIRENA) 20 MCG/24HR IUD, 1 each by Intrauterine route once., Disp: , Rfl:  .  Multiple Vitamin (MULTIVITAMIN) tablet, Take 1 tablet by mouth daily., Disp: , Rfl:  Medications: Physical Exam Vitals: BP 100/60   Pulse 90   Ht _0  (1.676 m)   Wt 126 lb (57.2 kg)   LMP 08/13/2019 (Approximate) Comment: spotting  BMI 20.34 kg/m   General: WF in NAD HEENT: normocephalic, anicteric Neck: no thyroid enlargement, no palpable nodules, no cervical lymphadenopathy  Pulmonary: No increased work of breathing, CTAB Cardiovascular: RRR, without murmur  Breast: Breast symmetrical, no tenderness, no palpable nodules or masses, no skin or nipple retraction present, no nipple discharge.  No axillary, infraclavicular or supraclavicular lymphadenopathy. Abdomen: Soft, non-tender, non-distended.  Umbilicus without lesions.  No hepatomegaly or masses palpable. No evidence of hernia. Genitourinary:   External: Normal external female genitalia.  Normal urethral meatus, normal Bartholin's and Skene's glands.    Vagina: Normal vaginal mucosa, no evidence of prolapse.    Cervix: Grossly normal in appearance, no bleeding, posterior, non-tender, IUD strings visible just inside os  Uterus: Anteverted, normal size, shape, and consistency, mobile, and non-tender, and deviated to the left.  Adnexa: No adnexal masses, non-tender  Rectal: deferred  Lymphatic: no evidence of inguinal lymphadenopathy Extremities: no edema, erythema, or tenderness Neurologic: Grossly intact Psychiatric: mood appropriate, affect full     Assessment: 43 y.o. G3 P2012 with normal annual gyn exam  Desires IUD replacement today, since IUD strings are visible. Plan:   1) Breast cancer screening - recommend monthly self breast exam and  annual screening mammograms. Mammogram was ordered today. Patient to schedule appointment for after 10/13/2019  2) Cervical cancer screening - Pap smear not done. ASCCP guidelines and rational discussed.  Patient opts for every 3 years screening interval  3) Contraception - see procedure note regarding replacement of her Mirena IUD  4) Routine healthcare maintenance including cholesterol and diabetes screening managed by PCP and is UTD.  5) RTO in 1 year and prn.  Dalia Heading, CNM     GYNECOLOGY OFFICE PROCEDURE NOTE  Tonya Estrada is a 43 y.o. [phone] here for Mirena IUD replacement. No GYN concerns.  Last pap smear was last year and was normal.  IUD Insertion Procedure Note Patient identified, informed consent performed, consent signed.   Discussed risks of irregular bleeding, cramping, infection, expulsion,malpositioning of the IUD or perforation of the uterus Time out was performed.    On bimanual exam, uterus was Anteverted. Speculum placed in the vagina.  IUD strings visualized at cervical os.  The strings were grasped with a packing forceps and the IUD was removed  intact. The cervix was then cleaned with Betadine x 2. Cervix was sprayed with Hurricaine anesthetic and  grasped anteriorly with a single tooth tenaculum.  Uterus sounded to 6.5 cm.  Mirena  IUD placed per manufacturer's recommendations.  Strings trimmed to 3 cm. Tenaculum was removed, and silver nitrate was applied to tenaculum sites for hemostasis.  Patient tolerated procedure well.   Patient was given post-procedure instructions.  She was advised to have backup contraception for one week.  Patient was also asked to check IUD strings periodically and follow up in 4 weeks for IUD check.  Dalia Heading, CNM

## 2019-10-13 ENCOUNTER — Ambulatory Visit (INDEPENDENT_AMBULATORY_CARE_PROVIDER_SITE_OTHER): Payer: BC Managed Care – PPO | Admitting: Certified Nurse Midwife

## 2019-10-13 ENCOUNTER — Encounter: Payer: Self-pay | Admitting: Certified Nurse Midwife

## 2019-10-13 ENCOUNTER — Other Ambulatory Visit: Payer: Self-pay

## 2019-10-13 VITALS — BP 100/70 | Ht 66.0 in | Wt 129.0 lb

## 2019-10-13 DIAGNOSIS — Z975 Presence of (intrauterine) contraceptive device: Secondary | ICD-10-CM | POA: Diagnosis not present

## 2019-10-13 DIAGNOSIS — Z30431 Encounter for routine checking of intrauterine contraceptive device: Secondary | ICD-10-CM

## 2019-10-13 NOTE — Progress Notes (Signed)
  History of Present Illness:  Tonya Estrada is a 43 y.o. that had a Mirena IUD placed approximately 1 month ago. Since that time, she states that she has had no extended problems. Had some cramping and spotting on the day of the procedure and none since.  PMHx: She  has a past medical history of Allergy and Cancer ([place]). Also,  has a past surgical history that includes Knee arthroscopy; Intrauterine device (iud) insertion (10/11/2014); Melanoma excision (06/2014); Wisdom tooth extraction; and Dilation and curettage of uterus., family history includes Basal cell carcinoma (age of onset: 49) in her father; Breast cancer (age of onset: 22) in an other family member; Cancer (age of onset: 39) in her paternal grandfather; Diabetes in her maternal grandfather; Heart disease in her maternal grandfather and maternal grandmother; Hypertension in her maternal grandmother; Multiple myeloma in her maternal grandmother; Rheum arthritis in her mother.,  reports that she has never smoked. She has never used smokeless tobacco. She reports current alcohol use of about 5.0 standard drinks of alcohol per week. She reports that she does not use drugs.  She has a current medication list which includes the following prescription(s): levonorgestrel and multivitamin. Also, is allergic to hydrocodone.  ROS  Physical Exam:  BP 100/70   Ht '5\' 6"'$  (1.676 m)   Wt 129 lb (58.5 kg)   BMI 20.82 kg/m  Body mass index is 20.82 kg/m. Constitutional: Well nourished, well developed female in no acute distress.  Neuro: Grossly intact Psych:  Normal mood and affect.    Pelvic exam: External/BUS: no lesion, no discharge Vagina: no bleeding Cervix: Two IUD strings palpated at the cervical os.   Assessment: IUD strings present in proper location; pt doing well  Plan:  RTO in 1 year for annual exam and prn  Dalia Heading, CNM

## 2019-10-16 ENCOUNTER — Inpatient Hospital Stay: Admission: RE | Admit: 2019-10-16 | Payer: BC Managed Care – PPO | Source: Ambulatory Visit

## 2019-10-20 ENCOUNTER — Other Ambulatory Visit: Payer: Self-pay

## 2019-10-20 ENCOUNTER — Ambulatory Visit
Admission: RE | Admit: 2019-10-20 | Discharge: 2019-10-20 | Disposition: A | Discharge status: Home / Self Care | Payer: BC Managed Care – PPO | Source: Ambulatory Visit | Attending: Certified Nurse Midwife | Admitting: Certified Nurse Midwife

## 2019-10-20 DIAGNOSIS — Z1231 Encounter for screening mammogram for malignant neoplasm of breast: Secondary | ICD-10-CM | POA: Diagnosis present

## 2019-10-23 ENCOUNTER — Other Ambulatory Visit: Payer: Self-pay | Admitting: Certified Nurse Midwife

## 2019-10-23 DIAGNOSIS — R928 Other abnormal and inconclusive findings on diagnostic imaging of breast: Secondary | ICD-10-CM

## 2019-11-08 ENCOUNTER — Ambulatory Visit
Admission: RE | Admit: 2019-11-08 | Discharge: 2019-11-08 | Disposition: A | Discharge status: Home / Self Care | Payer: BC Managed Care – PPO | Source: Ambulatory Visit | Attending: Certified Nurse Midwife | Admitting: Certified Nurse Midwife

## 2019-11-08 DIAGNOSIS — R928 Other abnormal and inconclusive findings on diagnostic imaging of breast: Secondary | ICD-10-CM

## 2019-12-06 ENCOUNTER — Encounter: Payer: Self-pay | Admitting: Nurse Practitioner

## 2019-12-06 ENCOUNTER — Ambulatory Visit: Payer: BC Managed Care – PPO | Admitting: Nurse Practitioner

## 2019-12-06 ENCOUNTER — Other Ambulatory Visit: Payer: Self-pay

## 2019-12-06 VITALS — BP 105/70 | HR 86 | Temp 98.0°F | Ht 66.0 in | Wt 125.0 lb

## 2019-12-06 DIAGNOSIS — Z111 Encounter for screening for respiratory tuberculosis: Secondary | ICD-10-CM | POA: Diagnosis not present

## 2019-12-06 DIAGNOSIS — Z23 Encounter for immunization: Secondary | ICD-10-CM | POA: Diagnosis not present

## 2019-12-06 DIAGNOSIS — Z0289 Encounter for other administrative examinations: Secondary | ICD-10-CM

## 2019-12-06 NOTE — Patient Instructions (Signed)
Healthy Eating Following a healthy eating pattern may help you to achieve and maintain a healthy body weight, reduce the risk of chronic disease, and live a long and productive life. It is important to follow a healthy eating pattern at an appropriate calorie level for your body. Your nutritional needs should be met primarily through food by choosing a variety of nutrient-rich foods. What are tips for following this plan? Reading food labels  Read labels and choose the following: ? Reduced or low sodium. ? Juices with 100% fruit juice. ? Foods with low saturated fats and high polyunsaturated and monounsaturated fats. ? Foods with whole grains, such as whole wheat, cracked wheat, brown rice, and wild rice. ? Whole grains that are fortified with folic acid. This is recommended for women who are pregnant or who want to become pregnant.  Read labels and avoid the following: ? Foods with a lot of added sugars. These include foods that contain brown sugar, corn sweetener, corn syrup, dextrose, fructose, glucose, high-fructose corn syrup, honey, invert sugar, lactose, malt syrup, maltose, molasses, raw sugar, sucrose, trehalose, or turbinado sugar.  Do not eat more than the following amounts of added sugar per day:  6 teaspoons (25 g) for women.  9 teaspoons (38 g) for men. ? Foods that contain processed or refined starches and grains. ? Refined grain products, such as white flour, degermed cornmeal, white bread, and white rice. Shopping  Choose nutrient-rich snacks, such as vegetables, whole fruits, and nuts. Avoid high-calorie and high-sugar snacks, such as potato chips, fruit snacks, and candy.  Use oil-based dressings and spreads on foods instead of solid fats such as butter, stick margarine, or cream cheese.  Limit pre-made sauces, mixes, and "instant" products such as flavored rice, instant noodles, and ready-made pasta.  Try more plant-protein sources, such as tofu, tempeh, black beans,  edamame, lentils, nuts, and seeds.  Explore eating plans such as the Mediterranean diet or vegetarian diet. Cooking  Use oil to saut or stir-fry foods instead of solid fats such as butter, stick margarine, or lard.  Try baking, boiling, grilling, or broiling instead of frying.  Remove the fatty part of meats before cooking.  Steam vegetables in water or broth. Meal planning   At meals, imagine dividing your plate into fourths: ? One-half of your plate is fruits and vegetables. ? One-fourth of your plate is whole grains. ? One-fourth of your plate is protein, especially lean meats, poultry, eggs, tofu, beans, or nuts.  Include low-fat dairy as part of your daily diet. Lifestyle  Choose healthy options in all settings, including home, work, school, restaurants, or stores.  Prepare your food safely: ? Wash your hands after handling raw meats. ? Keep food preparation surfaces clean by regularly washing with hot, soapy water. ? Keep raw meats separate from ready-to-eat foods, such as fruits and vegetables. ? Cook seafood, meat, poultry, and eggs to the recommended internal temperature. ? Store foods at safe temperatures. In general:  Keep cold foods at 59F (4.4C) or below.  Keep hot foods at 159F (60C) or above.  Keep your freezer at [facility] (-17.8C) or below.  Foods are no longer safe to eat when they have been between the temperatures of 40-159F (4.4-60C) for more than 2 hours. What foods should I eat? Fruits Aim to eat 2 cup-equivalents of fresh, canned (in natural juice), or frozen fruits each day. Examples of 1 cup-equivalent of fruit include 1 small apple, 8 large strawberries, 1 cup canned fruit,  cup  dried fruit, or 1 cup 100% juice. Vegetables Aim to eat 2-3 cup-equivalents of fresh and frozen vegetables each day, including different varieties and colors. Examples of 1 cup-equivalent of vegetables include 2 medium carrots, 2 cups raw, leafy greens, 1 cup chopped  vegetable (raw or cooked), or 1 medium baked potato. Grains Aim to eat 6 ounce-equivalents of whole grains each day. Examples of 1 ounce-equivalent of grains include 1 slice of bread, 1 cup ready-to-eat cereal, 3 cups popcorn, or  cup cooked rice, pasta, or cereal. Meats and other proteins Aim to eat 5-6 ounce-equivalents of protein each day. Examples of 1 ounce-equivalent of protein include 1 egg, 1/2 cup nuts or seeds, or 1 tablespoon (16 g) peanut butter. A cut of meat or fish that is the size of a deck of cards is about 3-4 ounce-equivalents.  Of the protein you eat each week, try to have at least 8 ounces come from seafood. This includes salmon, trout, herring, and anchovies. Dairy Aim to eat 3 cup-equivalents of fat-free or low-fat dairy each day. Examples of 1 cup-equivalent of dairy include 1 cup (240 mL) milk, 8 ounces (250 g) yogurt, 1 ounces (44 g) natural cheese, or 1 cup (240 mL) fortified soy milk. Fats and oils  Aim for about 5 teaspoons (21 g) per day. Choose monounsaturated fats, such as canola and olive oils, avocados, peanut butter, and most nuts, or polyunsaturated fats, such as sunflower, corn, and soybean oils, walnuts, pine nuts, sesame seeds, sunflower seeds, and flaxseed. Beverages  Aim for six 8-oz glasses of water per day. Limit coffee to three to five 8-oz cups per day.  Limit caffeinated beverages that have added calories, such as soda and energy drinks.  Limit alcohol intake to no more than 1 drink a day for nonpregnant women and 2 drinks a day for men. One drink equals 12 oz of beer (355 mL), 5 oz of wine (148 mL), or 1 oz of hard liquor (44 mL). Seasoning and other foods  Avoid adding excess amounts of salt to your foods. Try flavoring foods with herbs and spices instead of salt.  Avoid adding sugar to foods.  Try using oil-based dressings, sauces, and spreads instead of solid fats. This information is based on general [place] nutrition guidelines. For more  information, visit [employer].es. Exact amounts may vary based on your nutrition needs. Summary  A healthy eating plan may help you to maintain a healthy weight, reduce the risk of chronic diseases, and stay active throughout your life.  Plan your meals. Make sure you eat the right portions of a variety of nutrient-rich foods.  Try baking, boiling, grilling, or broiling instead of frying.  Choose healthy options in all settings, including home, work, school, restaurants, or stores. This information is not intended to replace advice given to you by your health care provider. Make sure you discuss any questions you have with your health care provider. Document Revised: 02/07/2018 Document Reviewed: 02/07/2018 Elsevier Patient Education  [place].

## 2019-12-06 NOTE — Progress Notes (Signed)
BP 105/70   Pulse 86   Temp 98 F (36.7 C) (Oral)   Ht 5\' 6"  (1.676 m)   Wt 125 lb (56.7 kg)   SpO2 100%   BMI 20.18 kg/m    Subjective:    Patient ID: Tonya Estrada, female    DOB: 08/11/1976, 44 y.o.   MRN: [id number]  HPI: Tonya Estrada is a 44 y.o. female  Chief Complaint  Patient presents with  . 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  Basic review of systems and immunizations.  Forms reviewed and discussed with patient.  Will obtain Quantiferon Gold for TB testing and update Tdap.    Relevant past medical, surgical, family and social history reviewed and updated as indicated. Interim medical history since our last visit reviewed. Allergies and medications reviewed and updated.  Review of Systems  Constitutional: Negative for activity change, appetite change, diaphoresis, fatigue and fever.  Respiratory: Negative for cough, chest tightness and shortness of breath.   Cardiovascular: Negative for chest pain, palpitations and leg swelling.  Gastrointestinal: Negative for abdominal distention, abdominal pain, constipation, diarrhea, nausea and vomiting.  Endocrine: Negative for cold intolerance, heat intolerance, polydipsia, polyphagia and polyuria.  Neurological: Negative for dizziness, syncope, weakness, light-headedness, numbness and headaches.  Psychiatric/Behavioral: Negative.     Per HPI unless specifically indicated above     Objective:    BP 105/70   Pulse 86   Temp 98 F (36.7 C) (Oral)   Ht 5\' 6"  (1.676 m)   Wt 125 lb (56.7 kg)   SpO2 100%   BMI 20.18 kg/m   Wt Readings from Last 3 Encounters:  12/06/19 125 lb (56.7 kg)  10/13/19 129 lb (58.5 kg)  09/13/19 126 lb (57.2 kg)    Physical Exam Vitals and nursing note reviewed.  Constitutional:      General: She is awake. She is not in acute distress.    Appearance: She is well-developed. She is not ill-appearing.  HENT:   Head: Normocephalic.     Right Ear: Hearing normal.     Left Ear: Hearing normal.  Eyes:     General: Lids are normal.        Right eye: No discharge.        Left eye: No discharge.     Conjunctiva/sclera: Conjunctivae normal.     Pupils: Pupils are equal, round, and reactive to light.  Neck:     Thyroid: No thyromegaly.     Vascular: No carotid bruit.  Cardiovascular:     Rate and Rhythm: Normal rate and regular rhythm.     Heart sounds: Normal heart sounds. No murmur. No gallop.   Pulmonary:     Effort: Pulmonary effort is normal. No accessory muscle usage or respiratory distress.     Breath sounds: Normal breath sounds.  Abdominal:     General: Bowel sounds are normal.     Palpations: Abdomen is soft.     Tenderness: There is no abdominal tenderness.  Musculoskeletal:     Cervical back: Normal range of motion and neck supple.     Right lower leg: No edema.     Left lower leg: No edema.  Skin:    General: Skin is warm and dry.  Neurological:     Mental Status: She is alert and oriented to person, place, and time.  Psychiatric:        Attention and  Perception: Attention normal.        Mood and Affect: Mood normal.        Behavior: Behavior normal. Behavior is cooperative.        Thought Content: Thought content normal.        Judgment: Judgment normal.     Results for orders placed or performed in visit on 10/18/18  CBC with Differential/Platelet  Result Value Ref Range   WBC 5.2 3.4 - 10.8 x10E3/uL   RBC 4.41 3.77 - 5.28 x10E6/uL   Hemoglobin 13.5 11.1 - 15.9 g/dL   Hematocrit 39.6 34.0 - 46.6 %   MCV 90 79 - 97 fL   MCH 30.6 26.6 - 33.0 pg   MCHC 34.1 31.5 - 35.7 g/dL   RDW 12.2 (L) 12.3 - 15.4 %   Platelets 213 150 - 450 x10E3/uL   Neutrophils 71 Not Estab. %   Lymphs 18 Not Estab. %   Monocytes 9 Not Estab. %   Eos 1 Not Estab. %   Basos 1 Not Estab. %   Neutrophils Absolute 3.7 1.4 - 7.0 x10E3/uL   Lymphocytes Absolute 0.9 0.7 - 3.1 x10E3/uL   Monocytes  Absolute 0.5 0.1 - 0.9 x10E3/uL   EOS (ABSOLUTE) 0.1 0.0 - 0.4 x10E3/uL   Basophils Absolute 0.0 0.0 - 0.2 x10E3/uL   Immature Granulocytes 0 Not Estab. %   Immature Grans (Abs) 0.0 0.0 - 0.1 x10E3/uL  Comprehensive metabolic panel  Result Value Ref Range   Glucose 61 (L) 65 - 99 mg/dL   BUN 13 6 - 24 mg/dL   Creatinine, Ser 0.69 0.57 - 1.00 mg/dL   GFR calc non Af Amer 108 >59 mL/min/1.73   GFR calc Af Amer 125 >59 mL/min/1.73   BUN/Creatinine Ratio 19 9 - 23   Sodium 141 134 - 144 mmol/L   Potassium 4.5 3.5 - 5.2 mmol/L   Chloride 103 96 - 106 mmol/L   CO2 23 20 - 29 mmol/L   Calcium 9.7 8.7 - 10.2 mg/dL   Total Protein 7.1 6.0 - 8.5 g/dL   Albumin 4.6 3.5 - 5.5 g/dL   Globulin, Total 2.5 1.5 - 4.5 g/dL   Albumin/Globulin Ratio 1.8 1.2 - 2.2   Bilirubin Total 0.5 0.0 - 1.2 mg/dL   Alkaline Phosphatase 51 39 - 117 IU/L   AST 17 0 - 40 IU/L   ALT 15 0 - 32 IU/L  TSH  Result Value Ref Range   TSH 1.040 0.450 - 4.500 uIU/mL  Lipid Panel w/o Chol/HDL Ratio  Result Value Ref Range   Cholesterol, Total 151 100 - 199 mg/dL   Triglycerides 66 0 - 149 mg/dL   HDL 58 >39 mg/dL   VLDL Cholesterol Cal 13 5 - 40 mg/dL   LDL Calculated 80 0 - 99 mg/dL      Assessment & Plan:   Problem List Items Addressed This Visit    None    Visit Diagnoses    History and physical examination, occupation    -  Primary   Forms for work completed and Tdap updated, along with labs for TB obtained.   Screening-pulmonary TB       For workplace purposes   Relevant Orders   QuantiFERON-TB Gold Plus       Follow up plan: Return in about 6 months (around 06/04/2020) for Annual physical.

## 2019-12-09 LAB — QUANTIFERON-TB GOLD PLUS
QuantiFERON Mitogen Value: >10 IU/mL
QuantiFERON Nil Value: 0.03 IU/mL
QuantiFERON TB1 Ag Value: 0.03 IU/mL
QuantiFERON TB2 Ag Value: 0.03 IU/mL
QuantiFERON-TB Gold Plus: NEGATIVE

## 2019-12-10 NOTE — Progress Notes (Signed)
Contacted via MyChart

## 2020-06-21 ENCOUNTER — Ambulatory Visit: Payer: BC Managed Care – PPO | Admitting: Nurse Practitioner

## 2020-06-21 ENCOUNTER — Other Ambulatory Visit: Payer: Self-pay

## 2020-06-21 ENCOUNTER — Encounter: Payer: Self-pay | Admitting: Nurse Practitioner

## 2020-06-21 VITALS — BP 114/76 | HR 72 | Temp 98.4°F | Wt 126.0 lb

## 2020-06-21 DIAGNOSIS — H9313 Tinnitus, bilateral: Secondary | ICD-10-CM | POA: Insufficient documentation

## 2020-06-21 NOTE — Patient Instructions (Signed)
Hearing Loss Hearing loss is a partial or total loss of the ability to hear. This can be temporary or permanent, and it can happen in one or both ears. Medical care is necessary to treat hearing loss properly and to prevent the condition from getting worse. Your hearing may partially or completely come back, depending on what caused your hearing loss and how severe it is. In some cases, hearing loss is permanent. What are the causes? Common causes of hearing loss include:  Too much wax in the ear canal.  Infection of the ear canal or middle ear.  Fluid in the middle ear.  Injury to the ear or surrounding area.  An object stuck in the ear.  A history of prolonged exposure to loud sounds, such as music. Less common causes of hearing loss include:  Tumors in the ear.  Viral or bacterial infections, such as meningitis.  A hole in the eardrum (perforated eardrum).  Problems with the hearing nerve that sends signals between the brain and the ear.  Certain medicines. What are the signs or symptoms? Symptoms of this condition may include:  Difficulty telling the difference between sounds.  Difficulty following a conversation when there is background noise.  Lack of response to sounds in your environment. This may be most noticeable when you do not respond to startling sounds.  Needing to turn up the volume on the television, radio, or other devices.  Ringing in the ears.  Dizziness. How is this diagnosed? This condition is diagnosed based on:  A physical exam.  A hearing test (audiometry). The audiometry test will be performed by a hearing specialist (audiologist). You may also be referred to an ear, nose, and throat (ENT) specialist (otolaryngologist). How is this treated? Treatment for hearing loss may include:  Ear wax removal.  Medicines to treat or prevent infection (antibiotics).  Medicines to reduce inflammation (corticosteroids).  Hearing aids for hearing  loss related to nerve damage. Follow these instructions at home:  If you were prescribed an antibiotic medicine, take it as told by your health care provider. Do not stop taking the antibiotic even if you start to feel better.  Take over-the-counter and prescription medicines only as told by your health care provider.  Avoid loud noises.  Return to your normal activities as told by your health care provider. Ask your health care provider what activities are safe for you.  Keep all follow-up visits as told by your health care provider. This is important. Contact a health care provider if:  You feel dizzy.  You develop new symptoms.  You vomit or feel nauseous.  You have a fever. Get help right away if:  You develop sudden changes in your vision.  You have severe ear pain.  You have new or increased weakness.  You have a severe headache. Summary  Hearing loss is a decreased ability to hear sounds around you. It can be temporary or permanent.  Treatment will depend on the cause of your hearing loss. It may include ear wax removal, medicines, or a hearing aid.  Your hearing may partially or completely come back, depending on what caused your hearing loss and how severe it is.  Keep all follow-up visits as told by your health care provider. This is important. This information is not intended to replace advice given to you by your health care provider. Make sure you discuss any questions you have with your health care provider. Document Revised: 07/26/2018 Document Reviewed: 07/26/2018 Elsevier Patient Education    2020 [employer]  

## 2020-06-21 NOTE — Progress Notes (Signed)
BP 114/76   Pulse 72   Temp 98.4 F (36.9 C) (Oral)   Wt 126 lb (57.2 kg)   SpO2 99%   BMI 20.34 kg/m    Subjective:    Patient ID: Tonya Estrada, female    DOB: [birth year], 44 y.o.   MRN: [id number]  HPI: Tonya Estrada is a 44 y.o. female  Chief Complaint  Patient presents with  . Tinnitus    pt states she has had ringing in her ears for the past couple of years. States it has gotten worse over the last few years    TINNITUS Has been present for two years, intermittently, but feels it has gotten worse and reports not hearing as well.  Has hearing loss in family -- several cousins and grandfather -- all on mother's side.  Currently is noticing tinnitus every day, present all the time but does not bother her. Duration: months Description of tinnitus: high-pitch, constant Pulsatile: no Tinnitus duration: continuous Episode frequency: continous Severity: mild Aggravating factors: loud environments, like concerts Alleviating factors: nothing Head injury: no Chronic exposure to loud noises: no Exposure to ototoxic medications: no Vertigo:not now, can not do any spiny rollercoasters anymore Hearing loss: yes Aural fullness: no Headache:no  TMJ syndrome symptoms: no -- does wear support for clenching Unsteady gait: no Postural instability: no Diplopia, dysarthria, dysphagia or weakness: no Anxietydepression: no  Relevant past medical, surgical, family and social history reviewed and updated as indicated. Interim medical history since our last visit reviewed. Allergies and medications reviewed and updated.  Review of Systems  Constitutional: Negative for activity change, appetite change, diaphoresis, fatigue and fever.  HENT: Negative for ear discharge and ear pain.   Respiratory: Negative for cough, chest tightness and shortness of breath.   Cardiovascular: Negative for chest pain, palpitations and leg swelling.  Gastrointestinal: Negative.     Psychiatric/Behavioral: Negative.     Per HPI unless specifically indicated above     Objective:    BP 114/76   Pulse 72   Temp 98.4 F (36.9 C) (Oral)   Wt 126 lb (57.2 kg)   SpO2 99%   BMI 20.34 kg/m   Wt Readings from Last 3 Encounters:  06/21/20 126 lb (57.2 kg)  12/06/19 125 lb (56.7 kg)  10/13/19 129 lb (58.5 kg)    Physical Exam Vitals and nursing note reviewed.  Constitutional:      General: She is awake. She is not in acute distress.    Appearance: She is well-developed and well-groomed. She is not ill-appearing.  HENT:     Head: Normocephalic.     Right Ear: Hearing, tympanic membrane, ear canal and external ear normal.     Left Ear: Hearing, tympanic membrane, ear canal and external ear normal.     Ears:     Weber exam findings: does not lateralize.    Right Rinne: AC > BC.    Left Rinne: AC > BC.    Nose: Nose normal.     Mouth/Throat:     Mouth: Mucous membranes are moist.  Eyes:     General: Lids are normal.        Right eye: No discharge.        Left eye: No discharge.     Conjunctiva/sclera: Conjunctivae normal.     Pupils: Pupils are equal, round, and reactive to light.  Neck:     Thyroid: No thyromegaly.     Vascular: No carotid bruit or JVD.  Cardiovascular:  Rate and Rhythm: Normal rate and regular rhythm.     Heart sounds: Normal heart sounds. No murmur heard.  No gallop.   Pulmonary:     Effort: Pulmonary effort is normal.     Breath sounds: Normal breath sounds.  Abdominal:     General: Bowel sounds are normal.     Palpations: Abdomen is soft. There is no hepatomegaly or splenomegaly.  Musculoskeletal:     Cervical back: Normal range of motion and neck supple.     Right lower leg: No edema.     Left lower leg: No edema.  Lymphadenopathy:     Cervical: No cervical adenopathy.  Skin:    General: Skin is warm and dry.  Neurological:     Mental Status: She is alert and oriented to person, place, and time.  Psychiatric:         Attention and Perception: Attention normal.        Mood and Affect: Mood normal.        Speech: Speech normal.        Behavior: Behavior normal. Behavior is cooperative.        Thought Content: Thought content normal.     Results for orders placed or performed in visit on 12/06/19  QuantiFERON-TB Gold Plus  Result Value Ref Range   QuantiFERON Incubation Incubation performed.    QuantiFERON Criteria Comment    QuantiFERON TB1 Ag Value 0.03 IU/mL   QuantiFERON TB2 Ag Value 0.03 IU/mL   QuantiFERON Nil Value 0.03 IU/mL   QuantiFERON Mitogen Value >10.00 IU/mL   QuantiFERON-TB Gold Plus Negative Negative      Assessment & Plan:   Problem List Items Addressed This Visit      Other   Tinnitus of both ears - Primary    Ongoing for 2 years with reported worsening and mild hearing loss.  Significant family history of similar.  Refer to physical exam.  Will place referral to ENT for further assessment and recommendations.  Return for worsening or ongoing.      Relevant Orders   Ambulatory referral to ENT       Follow up plan: Return if symptoms worsen or fail to improve.

## 2020-06-21 NOTE — Assessment & Plan Note (Signed)
Ongoing for 2 years with reported worsening and mild hearing loss.  Significant family history of similar.  Refer to physical exam.  Will place referral to ENT for further assessment and recommendations.  Return for worsening or ongoing.

## 2020-07-10 IMAGING — MG MM DIGITAL DIAGNOSTIC UNILAT*R* W/ TOMO W/ CAD
6 series · 6 of 18 positions shown · non-contrast
Comparison: Previous exam(s).

CLINICAL DATA: The patient was called back for a right breast
asymmetry.

EXAM:
DIGITAL DIAGNOSTIC RIGHT MAMMOGRAM WITH TOMO
ULTRASOUND RIGHT BREAST

[R MLO synth-2D]
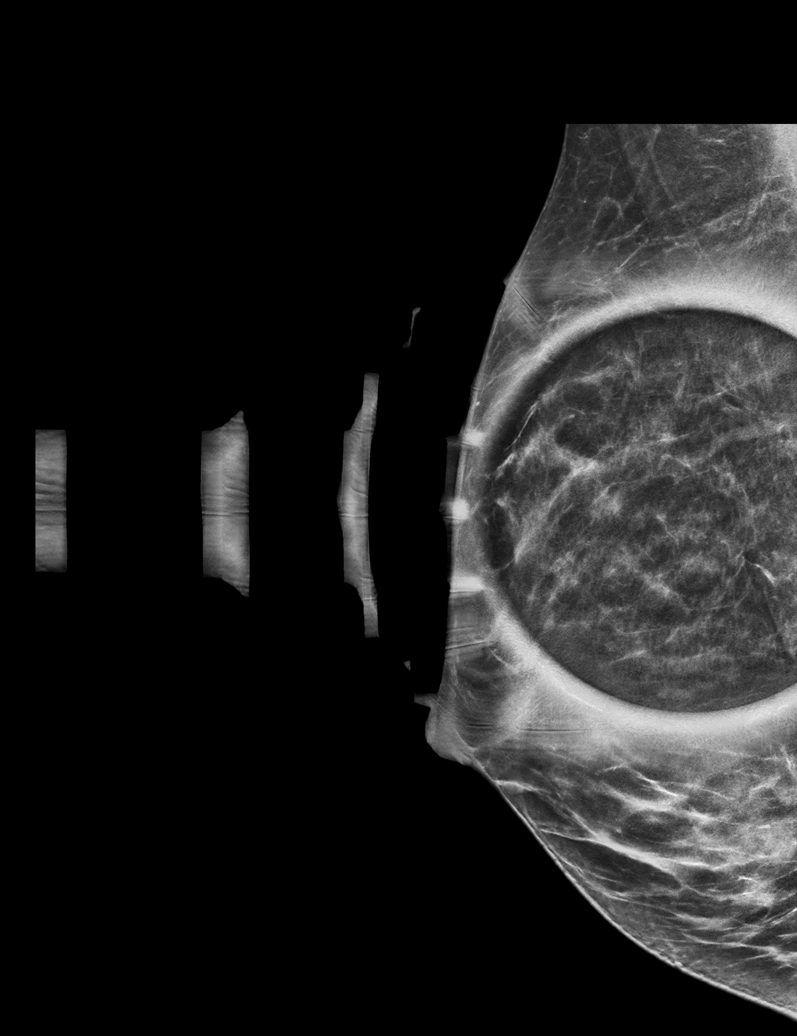

[R ML synth-2D]
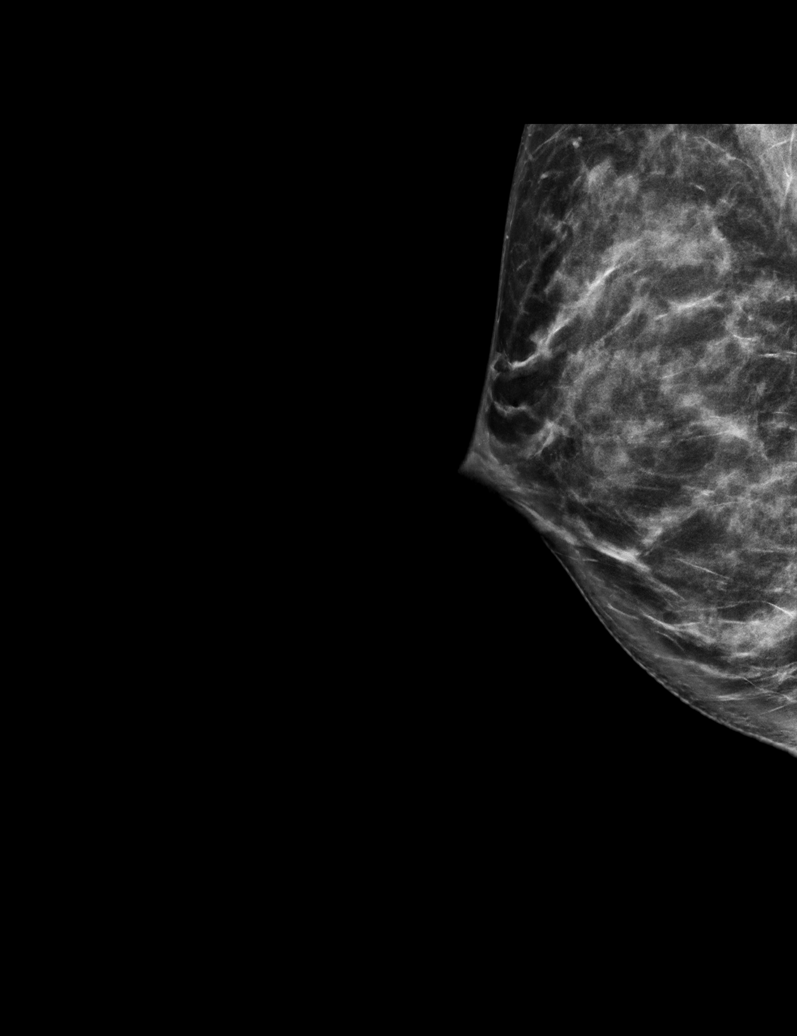

[R XCCL synth-2D]
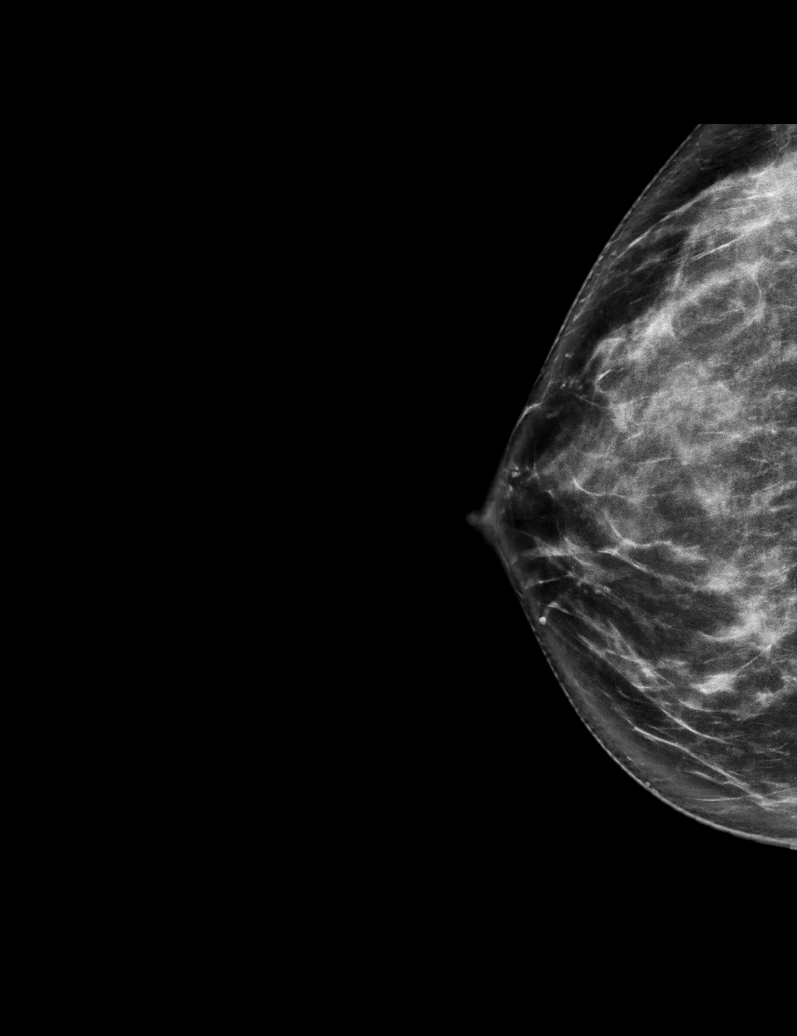

[R XCCL tomo · tomo slice 37/72.0]
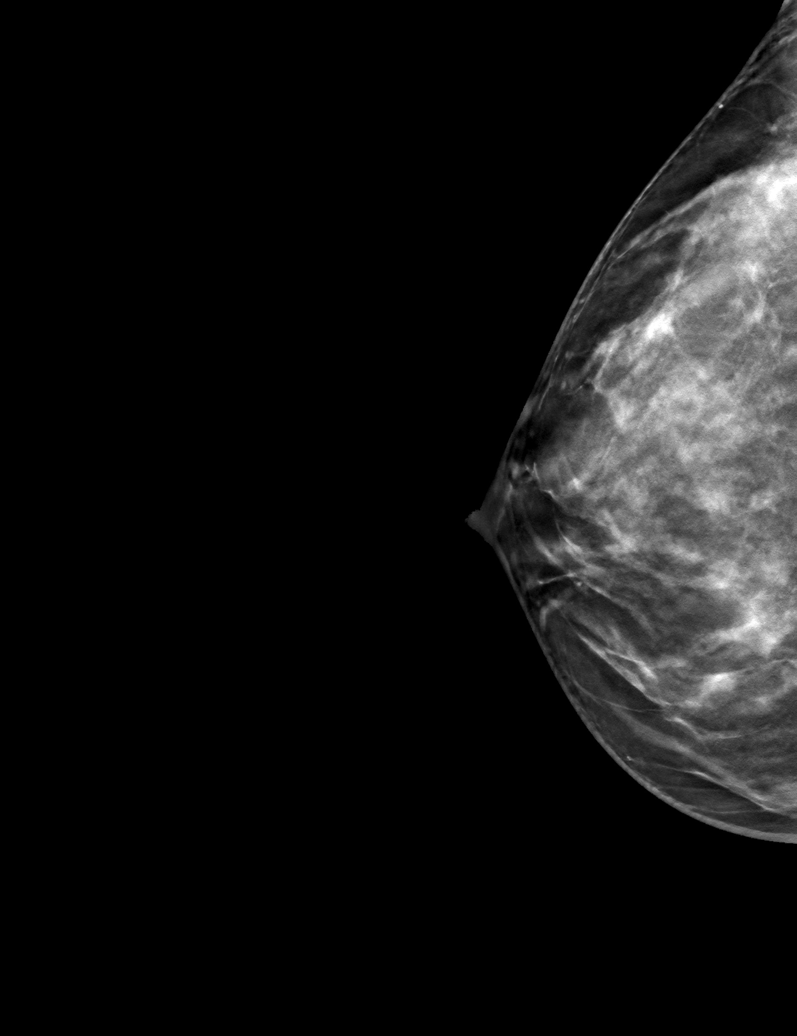

[R ML tomo · tomo slice 37/74.0]
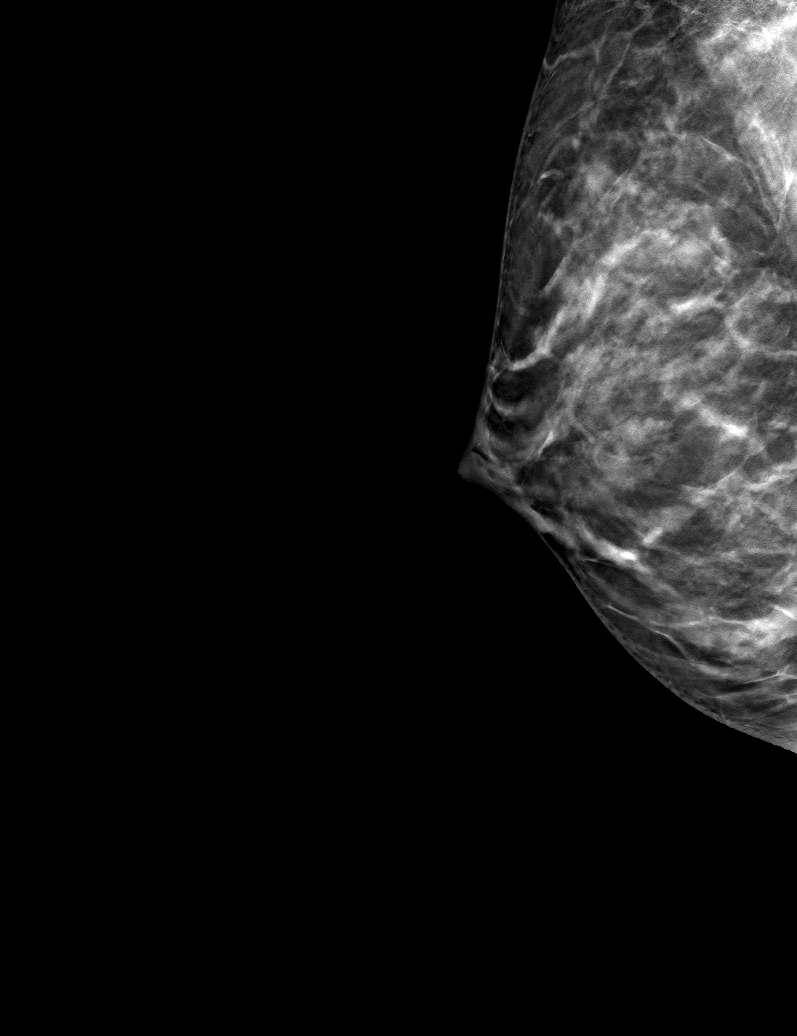

[R MLO tomo · tomo slice 28/55.0]
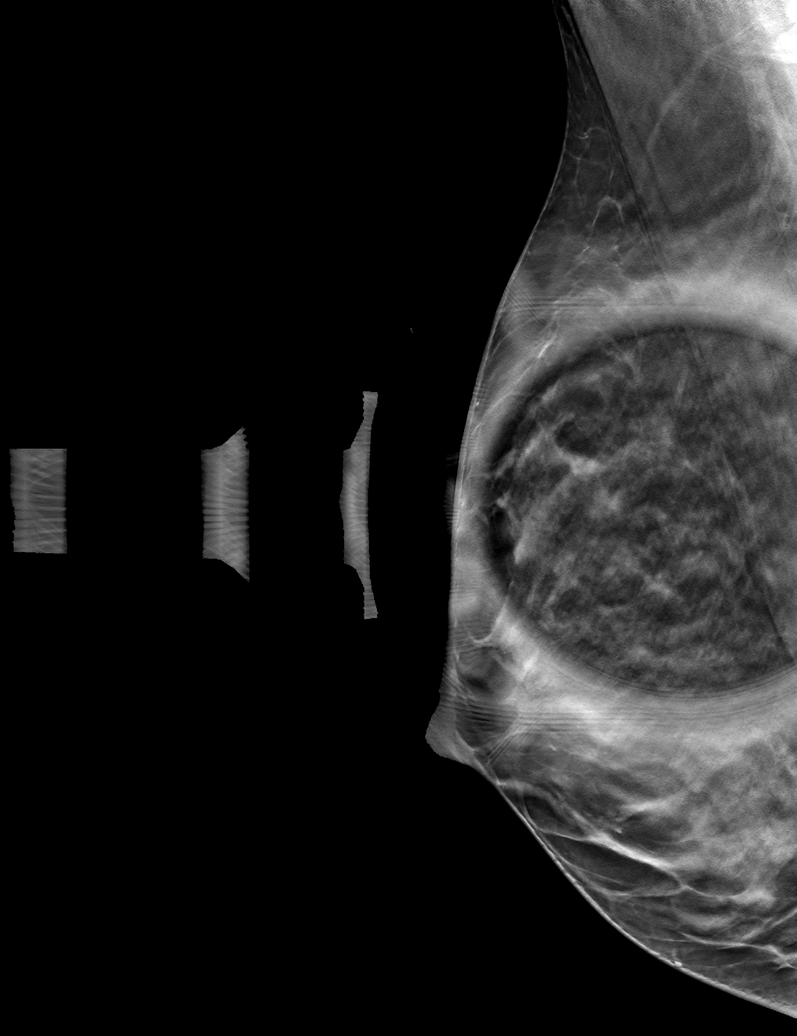

[6 of 18 positions shown; findings below may reference images not displayed]

ACR Breast Density Category c: The breast tissue is heterogeneously
dense, which may obscure small masses.
FINDINGS: The right breast asymmetry resolves on spot and repeat imaging.

On physical exam, no suspicious lumps are identified.

Targeted ultrasound is performed, showing no abnormalities in the
upper outer quadrant of the right breast.
IMPRESSION: No mammographic or sonographic evidence of malignancy.

RECOMMENDATION:
Annual screening mammography.

I have discussed the findings and recommendations with the patient.
If applicable, a reminder letter will be sent to the patient
regarding the next appointment.

BI-RADS CATEGORY  1: Negative.

## 2020-09-24 ENCOUNTER — Encounter: Payer: Self-pay | Admitting: Obstetrics

## 2020-09-24 ENCOUNTER — Ambulatory Visit (INDEPENDENT_AMBULATORY_CARE_PROVIDER_SITE_OTHER): Payer: BC Managed Care – PPO | Admitting: Obstetrics

## 2020-09-24 ENCOUNTER — Other Ambulatory Visit: Payer: Self-pay

## 2020-09-24 VITALS — BP 100/70 | HR 84 | Ht 66.0 in | Wt 124.0 lb

## 2020-09-24 DIAGNOSIS — Z01419 Encounter for gynecological examination (general) (routine) without abnormal findings: Secondary | ICD-10-CM | POA: Diagnosis not present

## 2020-09-24 DIAGNOSIS — Z1239 Encounter for other screening for malignant neoplasm of breast: Secondary | ICD-10-CM | POA: Diagnosis not present

## 2020-09-24 DIAGNOSIS — Z124 Encounter for screening for malignant neoplasm of cervix: Secondary | ICD-10-CM | POA: Diagnosis not present

## 2020-09-24 NOTE — Progress Notes (Signed)
Gynecology Annual Exam  PCP: Venita Lick, NP  Chief Complaint:  Chief Complaint  Patient presents with  . Gynecologic Exam    History of Present Illness: Patient is a 44 y.o. [id number] presents for annual exam. The patient has no complaints today.   LMP: No LMP recorded (lmp unknown). (Menstrual status: IUD). Average Interval: no mneses with IUD in place  Heavy Menses: no Clots: no Intermenstrual Bleeding: no Postcoital Bleeding: no Dysmenorrhea: no   The patient is sexually active. She currently uses IUD for contraception. She denies dyspareunia.  The patient does not perform self breast exams.  There is no notable family history of breast or ovarian cancer in her family.  The patient wears seatbelts: yes.   The patient has regular exercise: She used to walk or jog, but this has slacked off since COVID.    The patient denies current symptoms of depression.    Review of Systems: Review of Systems  Constitutional: Negative.   HENT: Negative.   Eyes: Negative.   Respiratory: Negative.   Cardiovascular: Negative.   Gastrointestinal: Negative.   Genitourinary: Negative.   Musculoskeletal: Negative.   Skin: Negative.   Endo/Heme/Allergies: Negative.   Psychiatric/Behavioral: Negative.   All other systems reviewed and are negative.   Past Medical History:  Patient Active Problem List   Diagnosis Date Noted  . Tinnitus of both ears 06/21/2020  . Heart palpitations 10/18/2018  . History of melanoma 01/03/2018  . Contact dermatitis 05/24/2017    Past Surgical History:  Past Surgical History:  Procedure Laterality Date  . DILATION AND CURETTAGE OF UTERUS    . INTRAUTERINE DEVICE (IUD) INSERTION  10/11/2014  . KNEE ARTHROSCOPY    . MELANOMA EXCISION  06/2014   both legs  . WISDOM TOOTH EXTRACTION      Gynecologic History:  No LMP recorded (lmp unknown). (Menstrual status: IUD). Contraception: IUD Last Pap: Results were: 2020 no abnormalities  Last  mammogram: 2020 Results were: normal , negative for abnormalities  Obstetric History: [id number]  Family History:  Family History  Problem Relation Age of Onset  . Basal cell carcinoma Father 7  . Rheum arthritis Mother   . Multiple myeloma Maternal Grandmother   . Heart disease Maternal Grandmother        MI  . Hypertension Maternal Grandmother   . Diabetes Maternal Grandfather        type 1  . Heart disease Maternal Grandfather   . Cancer Paternal Grandfather 85       brain  . Breast cancer Other 78    Social History:  Social History   Socioeconomic History  . Marital status: Married    Spouse name: Not on file  . Number of children: 2  . Years of education: Not on file  . Highest education level: Not on file  Occupational History  . Occupation: Pharmacist, hospital  Tobacco Use  . Smoking status: Never Smoker  . Smokeless tobacco: Never Used  Vaping Use  . Vaping Use: Never used  Substance and Sexual Activity  . Alcohol use: Yes    Alcohol/week: 5.0 standard drinks    Types: 5 Glasses of wine per week  . Drug use: No  . Sexual activity: Yes    Partners: Male    Birth control/protection: I.U.D.    Comment: Mirena  Other Topics Concern  . Not on file  Social History Narrative  . Not on file   Social Determinants of Health   Financial Resource  Strain:   . Difficulty of Paying Living Expenses: Not on file  Food Insecurity:   . Worried About Charity fundraiser in the Last Year: Not on file  . Ran Out of Food in the Last Year: Not on file  Transportation Needs:   . Lack of Transportation (Medical): Not on file  . Lack of Transportation (Non-Medical): Not on file  Physical Activity:   . Days of Exercise per Week: Not on file  . Minutes of Exercise per Session: Not on file  Stress:   . Feeling of Stress : Not on file  Social Connections:   . Frequency of Communication with Friends and Family: Not on file  . Frequency of Social Gatherings with Friends and Family: Not  on file  . Attends Religious Services: Not on file  . Active Member of Clubs or Organizations: Not on file  . Attends Archivist Meetings: Not on file  . Marital Status: Not on file  Intimate Partner Violence:   . Fear of Current or Ex-Partner: Not on file  . Emotionally Abused: Not on file  . Physically Abused: Not on file  . Sexually Abused: Not on file    Allergies:  Allergies  Allergen Reactions  . Hydrocodone Hives    Medications: Prior to Admission medications   Medication Sig Start Date End Date Taking? Authorizing Provider  cholecalciferol (VITAMIN D3) 25 MCG (1000 UNIT) tablet Take 1,000 Units by mouth daily.   Yes [provider]  levonorgestrel (MIRENA) 20 MCG/24HR IUD 1 each by Intrauterine route once.   Yes [provider]  mometasone (ELOCON) 0.1 % cream Apply 1 application topically as needed. 07/22/20  Yes [provider]  Multiple Vitamin (MULTIVITAMIN) tablet Take 1 tablet by mouth daily.   Yes [provider]    Physical Exam Vitals: Blood pressure 100/70, pulse 84, height 5' 6"  (1.676 m), weight 124 lb (56.2 kg).  General: NAD HEENT: normocephalic, anicteric Thyroid: no enlargement, no palpable nodules Pulmonary: No increased work of breathing, CTAB Cardiovascular: RRR, distal pulses 2+ Breast: Breast symmetrical, no tenderness, no palpable nodules or masses, no skin or nipple retraction present, no nipple discharge.  No axillary or supraclavicular lymphadenopathy. Abdomen: NABS, soft, non-tender, non-distended.  Umbilicus without lesions.  No hepatomegaly, splenomegaly or masses palpable. No evidence of hernia  Genitourinary:  External: Normal external female genitalia.  Normal urethral meatus, normal Bartholin's and Skene's glands.    Vagina: Normal vaginal mucosa, no evidence of prolapse.    Cervix: Grossly normal in appearance, no bleeding  Uterus: Non-enlarged, mobile, normal contour.  No CMT  Adnexa:  ovaries non-enlarged, no adnexal masses  Rectal: deferred  Lymphatic: no evidence of inguinal lymphadenopathy Extremities: no edema, erythema, or tenderness Neurologic: Grossly intact Psychiatric: mood appropriate, affect full  Female chaperone present for pelvic and breast  portions of the physical exam    Assessment: 44 y.o. [id number] routine annual exam  Plan: Problem List Items Addressed This Visit    None    Visit Diagnoses    Encounter for gynecological examination    -  Primary   Women's annual routine gynecological examination       Relevant Orders   Cytology - PAP   Cervical cancer screening       Relevant Orders   Cytology - PAP   Encounter for breast cancer screening using non-mammogram modality       Relevant Orders   MM DIGITAL SCREENING BILATERAL  1) Mammogram - recommend yearly screening mammogram.  Mammogram Was ordered today   2) STI screening  wasoffered and declined  3) ASCCP guidelines and rational discussed.  Patient opts for every 3 years screening interval  4) Contraception - the patient is currently using  IUD.  She is happy with her current form of contraception and plans to continue  5) Colonoscopy -- Screening recommended starting at age 29 for average risk individuals, age 32 for individuals deemed at increased risk (including African Americans) and recommended to continue until age 52.  For patient age 79-85 individualized approach is recommended.  Gold standard screening is via colonoscopy, Cologuard screening is an acceptable alternative for patient unwilling or unable to undergo colonoscopy.  "Colorectal cancer screening for average?risk adults: 2018 guideline update from the [employer]: A Cancer Journal for Clinicians: Apr 07, 2017   6) Routine healthcare maintenance including cholesterol, diabetes screening discussed managed by PCP  7) Return in about 1 year (around 09/24/2021) for annual.  Imagene Riches, CNM   09/24/2020 4:26 PM  [facility] 09/24/2020, 4:26 PM

## 2020-09-24 NOTE — Addendum Note (Signed)
Addended by: Imagene Riches on: 09/24/2020 04:32 PM   Modules accepted: Orders

## 2020-10-21 ENCOUNTER — Other Ambulatory Visit: Payer: Self-pay | Admitting: Obstetrics

## 2020-10-21 DIAGNOSIS — Z1231 Encounter for screening mammogram for malignant neoplasm of breast: Secondary | ICD-10-CM

## 2020-11-25 ENCOUNTER — Inpatient Hospital Stay: Admission: RE | Admit: 2020-11-25 | Payer: BC Managed Care – PPO | Source: Ambulatory Visit

## 2020-12-12 ENCOUNTER — Other Ambulatory Visit: Payer: Self-pay

## 2020-12-12 ENCOUNTER — Ambulatory Visit
Admission: RE | Admit: 2020-12-12 | Discharge: 2020-12-12 | Disposition: A | Discharge status: Home / Self Care | Payer: BC Managed Care – PPO | Source: Ambulatory Visit | Attending: Obstetrics | Admitting: Obstetrics

## 2020-12-12 DIAGNOSIS — Z1231 Encounter for screening mammogram for malignant neoplasm of breast: Secondary | ICD-10-CM | POA: Insufficient documentation

## 2021-01-10 ENCOUNTER — Ambulatory Visit (INDEPENDENT_AMBULATORY_CARE_PROVIDER_SITE_OTHER): Payer: BC Managed Care – PPO | Admitting: Nurse Practitioner

## 2021-01-10 ENCOUNTER — Encounter: Payer: Self-pay | Admitting: Nurse Practitioner

## 2021-01-10 ENCOUNTER — Other Ambulatory Visit: Payer: Self-pay

## 2021-01-10 DIAGNOSIS — H6982 Other specified disorders of Eustachian tube, left ear: Secondary | ICD-10-CM | POA: Insufficient documentation

## 2021-01-10 MED ORDER — PREDNISONE 20 MG PO TABS: 40.0000 mg | ORAL_TABLET | Freq: Every day | ORAL | 0 refills | Status: AC

## 2021-01-10 NOTE — Patient Instructions (Signed)

## 2021-01-10 NOTE — Progress Notes (Signed)
BP 114/80   Pulse 80   Temp 99.2 F (37.3 C) (Oral)   Wt 124 lb 6.4 oz (56.4 kg)   SpO2 100%   BMI 20.08 kg/m    Subjective:    Patient ID: Tonya Estrada, female    DOB: November 11, 1975, 45 y.o.   MRN: [id number]  HPI: Tonya Estrada is a 45 y.o. female  Chief Complaint  Patient presents with  . Ear Pain    Patient states she has been having issues with her left ear for about a week and states she doesn't necessarily have issues with it in the mornings or mid-day but she has majority of the issues with it in the evenings or nights. Patient stats she will have pain with a burp or yawn and just wanted to have it checked out.   EAR PAIN Started about one week ago to left ear. Notices it more in the evenings.  When belches or yawns notices it more - more sharp at that time.  Does at baseline have allergies.   Duration: days Involved ear(s): left Severity:  mild  Quality:  dull, aching and pressure-like Fever: no Otorrhea: no Upper respiratory infection symptoms: no Pruritus: no Hearing loss: no Water immersion no Using Q-tips: no Recurrent otitis media: no Status: stable Treatments attempted: none  Relevant past medical, surgical, family and social history reviewed and updated as indicated. Interim medical history since our last visit reviewed. Allergies and medications reviewed and updated.  Review of Systems  Constitutional: Negative.   HENT: Negative for congestion, ear discharge, ear pain, postnasal drip, rhinorrhea, sinus pressure, sore throat and voice change.   Respiratory: Negative.   Cardiovascular: Negative.   Psychiatric/Behavioral: Negative.     Per HPI unless specifically indicated above     Objective:    BP 114/80   Pulse 80   Temp 99.2 F (37.3 C) (Oral)   Wt 124 lb 6.4 oz (56.4 kg)   SpO2 100%   BMI 20.08 kg/m   Wt Readings from Last 3 Encounters:  01/10/21 124 lb 6.4 oz (56.4 kg)  09/24/20 124 lb (56.2 kg)  06/21/20 126 lb (57.2 kg)     Physical Exam Vitals and nursing note reviewed.  Constitutional:      General: She is awake.     Appearance: She is well-developed and well-groomed. She is not ill-appearing.  HENT:     Head: Normocephalic.     Right Ear: Hearing, ear canal and external ear normal. No drainage. No middle ear effusion. Tympanic membrane is not injected.     Left Ear: Hearing, ear canal and external ear normal. No drainage. A middle ear effusion is present. Tympanic membrane is not injected.  Eyes:     General: Lids are normal.        Right eye: No discharge.        Left eye: No discharge.     Conjunctiva/sclera: Conjunctivae normal.     Pupils: Pupils are equal, round, and reactive to light.  Cardiovascular:     Rate and Rhythm: Normal rate and regular rhythm.     Heart sounds: Normal heart sounds. No murmur heard. No gallop.   Pulmonary:     Effort: Pulmonary effort is normal. No accessory muscle usage or respiratory distress.     Breath sounds: Normal breath sounds.  Abdominal:     General: Bowel sounds are normal.     Palpations: Abdomen is soft. There is no hepatomegaly or splenomegaly.  Musculoskeletal:     Cervical back: Normal range of motion and neck supple.     Right lower leg: No edema.     Left lower leg: No edema.  Skin:    General: Skin is warm and dry.  Neurological:     Mental Status: She is alert and oriented to person, place, and time.  Psychiatric:        Attention and Perception: Attention normal.        Mood and Affect: Mood normal.        Behavior: Behavior normal. Behavior is cooperative.        Thought Content: Thought content normal.        Judgment: Judgment normal.    Results for orders placed or performed in visit on 12/06/19  QuantiFERON-TB Gold Plus  Result Value Ref Range   QuantiFERON Incubation Incubation performed.    QuantiFERON Criteria Comment    QuantiFERON TB1 Ag Value 0.03 IU/mL   QuantiFERON TB2 Ag Value 0.03 IU/mL   QuantiFERON Nil Value 0.03  IU/mL   QuantiFERON Mitogen Value >10.00 IU/mL   QuantiFERON-TB Gold Plus Negative Negative      Assessment & Plan:   Problem List Items Addressed This Visit      Nervous and Auditory   Eustachian tube dysfunction, left    Acute, suspect related to allergies.  Recommend she start taking daily Allegra or Claritin with season starting and weather changes, may stop at end of season.  Script for Prednisone 40 MG x 5 days sent for treatment.  No use of Q-tips.  Return as needed.          Follow up plan: Return if symptoms worsen or fail to improve.

## 2021-01-10 NOTE — Assessment & Plan Note (Signed)
Acute, suspect related to allergies.  Recommend she start taking daily Allegra or Claritin with season starting and weather changes, may stop at end of season.  Script for Prednisone 40 MG x 5 days sent for treatment.  No use of Q-tips.  Return as needed.

## 2021-09-26 ENCOUNTER — Ambulatory Visit: Payer: BC Managed Care – PPO | Admitting: Obstetrics

## 2021-09-30 ENCOUNTER — Encounter: Payer: Self-pay | Admitting: Obstetrics

## 2021-09-30 ENCOUNTER — Other Ambulatory Visit: Payer: Self-pay

## 2021-09-30 ENCOUNTER — Ambulatory Visit (INDEPENDENT_AMBULATORY_CARE_PROVIDER_SITE_OTHER): Payer: BC Managed Care – PPO | Admitting: Obstetrics

## 2021-09-30 ENCOUNTER — Other Ambulatory Visit (HOSPITAL_COMMUNITY)
Admission: RE | Admit: 2021-09-30 | Discharge: 2021-09-30 | Disposition: A | Discharge status: Home / Self Care | Payer: BC Managed Care – PPO | Source: Ambulatory Visit | Attending: Obstetrics | Admitting: Obstetrics

## 2021-09-30 VITALS — BP 120/60 | Ht 66.0 in | Wt 131.0 lb

## 2021-09-30 DIAGNOSIS — Z124 Encounter for screening for malignant neoplasm of cervix: Secondary | ICD-10-CM

## 2021-09-30 DIAGNOSIS — Z1239 Encounter for other screening for malignant neoplasm of breast: Secondary | ICD-10-CM | POA: Diagnosis not present

## 2021-09-30 DIAGNOSIS — Z01419 Encounter for gynecological examination (general) (routine) without abnormal findings: Secondary | ICD-10-CM

## 2021-09-30 NOTE — Progress Notes (Signed)
Gynecology Annual Exam  PCP: Venita Lick, NP  Chief Complaint:  Chief Complaint  Patient presents with   Gynecologic Exam    No concerns    History of Present Illness: Patient is a 45 y.o. [id number] presents for annual exam. The patient has no complaints today. She had a mammogram in February of this year.Uses an IUD for birth control. She is married, sexually active . She denies any problems today.  LMP: No LMP recorded. (Menstrual status: IUD).  The patient is sexually active. She currently uses IUD for contraception. She denies dyspareunia.  The patient does perform self breast exams.  There is no notable family history of breast or ovarian cancer in her family.  The patient wears seatbelts: yes.   The patient has regular exercise:  yes, she is a regular walker .    The patient denies current symptoms of depression.    Review of Systems: Review of Systems  Constitutional: Negative.   HENT: Negative.    Eyes: Negative.   Respiratory: Negative.    Cardiovascular: Negative.   Gastrointestinal: Negative.   Genitourinary: Negative.   Musculoskeletal: Negative.   Skin: Negative.   Neurological: Negative.   Endo/Heme/Allergies: Negative.   Psychiatric/Behavioral: Negative.     Past Medical History:  Patient Active Problem List   Diagnosis Date Noted   Eustachian tube dysfunction, left 01/10/2021   Heart palpitations 10/18/2018   History of melanoma 01/03/2018   Contact dermatitis 05/24/2017    Past Surgical History:  Past Surgical History:  Procedure Laterality Date   DILATION AND CURETTAGE OF UTERUS     INTRAUTERINE DEVICE (IUD) INSERTION  10/11/2014   KNEE ARTHROSCOPY     MELANOMA EXCISION  06/2014   both legs   WISDOM TOOTH EXTRACTION      Gynecologic History:  No LMP recorded. (Menstrual status: IUD). Contraception: IUD Last Pap: Results were: NILM no abnormalities  Last mammogram: 2022 Results were: BI-RAD I  Obstetric History: [id number]  Family  History:  Family History  Problem Relation Age of Onset   Basal cell carcinoma Father 71   Rheum arthritis Mother    Multiple myeloma Maternal Grandmother    Heart disease Maternal Grandmother        MI   Hypertension Maternal Grandmother    Diabetes Maternal Grandfather        type 1   Heart disease Maternal Grandfather    Cancer Paternal Grandfather 15       brain   Breast cancer Other 55    Social History:  Social History   Socioeconomic History   Marital status: Married    Spouse name: Not on file   Number of children: 2   Years of education: Not on file   Highest education level: Not on file  Occupational History   Occupation: Pharmacist, hospital  Tobacco Use   Smoking status: Never   Smokeless tobacco: Never  Vaping Use   Vaping Use: Never used  Substance and Sexual Activity   Alcohol use: Yes    Alcohol/week: 5.0 standard drinks    Types: 5 Glasses of wine per week   Drug use: No   Sexual activity: Yes    Partners: Male    Birth control/protection: I.U.D.    Comment: Mirena  Other Topics Concern   Not on file  Social History Narrative   Not on file   Social Determinants of Health   Financial Resource Strain: Not on file  Food Insecurity: Not on file  Transportation Needs: Not on file  Physical Activity: Not on file  Stress: Not on file  Social Connections: Not on file  Intimate Partner Violence: Not on file    Allergies:  Allergies  Allergen Reactions   Hydrocodone Hives    Medications: Prior to Admission medications   Medication Sig Start Date End Date Taking? Authorizing Provider  levonorgestrel (MIRENA) 20 MCG/24HR IUD 1 each by Intrauterine route once.   Yes [provider]  mometasone (ELOCON) 0.1 % cream Apply 1 application topically as needed. 07/22/20  Yes [provider]  Multiple Vitamin (MULTIVITAMIN) tablet Take 1 tablet by mouth daily.   Yes [provider]    Physical Exam Vitals: Blood pressure 120/60, height  5' 6"  (1.676 m), weight 131 lb (59.4 kg).  General: NAD HEENT: normocephalic, anicteric Thyroid: no enlargement, no palpable nodules Pulmonary: No increased work of breathing, CTAB Cardiovascular: RRR, distal pulses 2+ Breast: Breast symmetrical,  B cup size;no tenderness, no palpable nodules or masses, no skin or nipple retraction present, no nipple discharge.  No axillary or supraclavicular lymphadenopathy. Abdomen: NABS, soft, non-tender, non-distended.  Umbilicus without lesions.  No hepatomegaly, splenomegaly or masses palpable. No evidence of hernia  Genitourinary:  External: Normal external female genitalia.  Normal urethral meatus, normal Bartholin's and Skene's glands.    Vagina: Normal vaginal mucosa, no evidence of prolapse.    Cervix: Grossly normal in appearance, no bleeding  Uterus: anteverted,Non-enlarged, mobile, normal contour.  No CMT  Adnexa: ovaries non-enlarged, no adnexal masses  Rectal: deferred  Lymphatic: no evidence of inguinal lymphadenopathy Extremities: no edema, erythema, or tenderness Neurologic: Grossly intact Psychiatric: mood appropriate, affect full  Female chaperone present for pelvic and breast  portions of the physical exam    Assessment: 45 y.o. [id number] routine annual exam  Plan: Problem List Items Addressed This Visit   None Visit Diagnoses     Cervical cancer screening    -  Primary   Relevant Orders   Cytology - PAP   Women's annual routine gynecological examination       Encounter for breast cancer screening using non-mammogram modality       Relevant Orders   MM DIGITAL SCREENING BILATERAL       1) Mammogram - recommend yearly screening mammogram.  Mammogram Was ordered today for next February   2) STI screening  was notoffered and therefore not obtained  3) ASCCP guidelines and rational discussed.  Patient opts for every 3 years screening interval  4) Contraception - the patient is currently using  IUD.  She is happy with  her current form of contraception and plans to continue  5) Colonoscopy -- Screening recommended starting at age 35 for average risk individuals, age 73 for individuals deemed at increased risk (including African Americans) and recommended to continue until age 85.  For patient age 56-85 individualized approach is recommended.  Gold standard screening is via colonoscopy, Cologuard screening is an acceptable alternative for patient unwilling or unable to undergo colonoscopy.  "Colorectal cancer screening for average?risk adults: 2018 guideline update from the American Cancer Society"CA: A Cancer Journal for Clinicians: Apr 07, 2017   6) Routine healthcare maintenance including cholesterol, diabetes screening discussed managed by PCP  7) No follow-ups on file.  Imagene Riches, CNM  09/30/2021 4:11 PM   [facility], [facility] 09/30/2021, 4:11 PM

## 2021-10-07 LAB — CYTOLOGY - PAP
Comment: NEGATIVE
Diagnosis: NEGATIVE
Diagnosis: REACTIVE
High risk HPV: NEGATIVE

## 2021-10-08 ENCOUNTER — Encounter: Payer: Self-pay | Admitting: Obstetrics

## 2021-11-14 ENCOUNTER — Other Ambulatory Visit: Payer: Self-pay | Admitting: Nurse Practitioner

## 2021-11-14 ENCOUNTER — Other Ambulatory Visit: Payer: Self-pay | Admitting: Obstetrics

## 2021-11-14 DIAGNOSIS — Z1231 Encounter for screening mammogram for malignant neoplasm of breast: Secondary | ICD-10-CM

## 2022-01-19 ENCOUNTER — Ambulatory Visit
Admission: RE | Admit: 2022-01-19 | Discharge: 2022-01-19 | Disposition: A | Discharge status: Home / Self Care | Payer: BC Managed Care – PPO | Source: Ambulatory Visit | Attending: Nurse Practitioner | Admitting: Nurse Practitioner

## 2022-01-19 ENCOUNTER — Other Ambulatory Visit: Payer: Self-pay

## 2022-01-19 DIAGNOSIS — Z1231 Encounter for screening mammogram for malignant neoplasm of breast: Secondary | ICD-10-CM | POA: Diagnosis present

## 2022-01-19 NOTE — Progress Notes (Signed)
Contacted via [place] ? ? ?Good evening. I see you have to go for repeat imaging.  Big breaths.  I have had to do this many times myself, as I see you have too.  Looks like they want to get a second look at left breast due to some asymmetrical tissue.  Any questions?

## 2022-01-20 ENCOUNTER — Other Ambulatory Visit: Payer: Self-pay | Admitting: Nurse Practitioner

## 2022-01-20 DIAGNOSIS — N6489 Other specified disorders of breast: Secondary | ICD-10-CM

## 2022-01-20 DIAGNOSIS — R928 Other abnormal and inconclusive findings on diagnostic imaging of breast: Secondary | ICD-10-CM

## 2022-02-16 ENCOUNTER — Ambulatory Visit
Admission: RE | Admit: 2022-02-16 | Discharge: 2022-02-16 | Disposition: A | Discharge status: Home / Self Care | Payer: BC Managed Care – PPO | Source: Ambulatory Visit | Attending: Nurse Practitioner | Admitting: Nurse Practitioner

## 2022-02-16 ENCOUNTER — Other Ambulatory Visit: Payer: Self-pay | Admitting: Nurse Practitioner

## 2022-02-16 DIAGNOSIS — R928 Other abnormal and inconclusive findings on diagnostic imaging of breast: Secondary | ICD-10-CM

## 2022-02-16 DIAGNOSIS — N6489 Other specified disorders of breast: Secondary | ICD-10-CM | POA: Diagnosis present

## 2022-02-17 ENCOUNTER — Other Ambulatory Visit: Payer: Self-pay | Admitting: Nurse Practitioner

## 2022-02-17 DIAGNOSIS — N63 Unspecified lump in unspecified breast: Secondary | ICD-10-CM

## 2022-02-17 DIAGNOSIS — R928 Other abnormal and inconclusive findings on diagnostic imaging of breast: Secondary | ICD-10-CM

## 2022-03-03 ENCOUNTER — Ambulatory Visit
Admission: RE | Admit: 2022-03-03 | Discharge: 2022-03-03 | Disposition: A | Discharge status: Home / Self Care | Payer: BC Managed Care – PPO | Source: Ambulatory Visit | Attending: Nurse Practitioner | Admitting: Nurse Practitioner

## 2022-03-03 DIAGNOSIS — R928 Other abnormal and inconclusive findings on diagnostic imaging of breast: Secondary | ICD-10-CM | POA: Insufficient documentation

## 2022-03-03 DIAGNOSIS — N63 Unspecified lump in unspecified breast: Secondary | ICD-10-CM | POA: Insufficient documentation

## 2022-03-04 LAB — SURGICAL PATHOLOGY

## 2022-06-20 NOTE — Patient Instructions (Signed)

## 2022-06-24 ENCOUNTER — Ambulatory Visit (INDEPENDENT_AMBULATORY_CARE_PROVIDER_SITE_OTHER): Payer: BC Managed Care – PPO | Admitting: Nurse Practitioner

## 2022-06-24 ENCOUNTER — Encounter: Payer: Self-pay | Admitting: Nurse Practitioner

## 2022-06-24 VITALS — BP 117/78 | HR 77 | Temp 98.5°F | Ht 67.32 in | Wt 132.6 lb

## 2022-06-24 DIAGNOSIS — Z1159 Encounter for screening for other viral diseases: Secondary | ICD-10-CM

## 2022-06-24 DIAGNOSIS — R928 Other abnormal and inconclusive findings on diagnostic imaging of breast: Secondary | ICD-10-CM

## 2022-06-24 DIAGNOSIS — Z136 Encounter for screening for cardiovascular disorders: Secondary | ICD-10-CM | POA: Diagnosis not present

## 2022-06-24 DIAGNOSIS — Z13220 Encounter for screening for lipoid disorders: Secondary | ICD-10-CM | POA: Diagnosis not present

## 2022-06-24 DIAGNOSIS — R002 Palpitations: Secondary | ICD-10-CM | POA: Diagnosis not present

## 2022-06-24 DIAGNOSIS — M26621 Arthralgia of right temporomandibular joint: Secondary | ICD-10-CM | POA: Diagnosis not present

## 2022-06-24 DIAGNOSIS — Z0000 Encounter for general adult medical examination without abnormal findings: Secondary | ICD-10-CM

## 2022-06-24 DIAGNOSIS — M26629 Arthralgia of temporomandibular joint, unspecified side: Secondary | ICD-10-CM | POA: Insufficient documentation

## 2022-06-24 NOTE — Progress Notes (Signed)
BP 117/78   Pulse 77   Temp 98.5 F (36.9 C) (Oral)   Ht 5' 7.32" (1.71 m)   Wt 132 lb 9.6 oz (60.1 kg)   SpO2 97%   BMI 20.57 kg/m    Subjective:    Patient ID: Tonya Estrada, female    DOB: Sep 22, 1976, 46 y.o.   MRN: [id number]  HPI: Tonya Estrada is a 46 y.o. female presenting on 06/24/2022 for comprehensive medical examination. Current medical complaints include: right jaw pain  She currently lives with: significant other Menopausal Symptoms: no  Has occasional heart palpitations, no shortness of breath or chest pain.    JAW PAIN: Has locked up twice in the past two months, has mouth guard her dentist gave her in past.  Wears every night to prevent grinding teeth.  Mouth guard is about 45 years old.  Last episode 1 1/2 weeks ago.  Will lock up for 5-10 seconds, very painful, then will release.  9/10 pain, as soon as pops back into place pain improves.  Does not take any treatment for this.  Does not chew gum.  Notices this to both sides, but right side locks on her.    Depression Screen done today and results listed below:     06/24/2022    3:22 PM 06/21/2020    2:03 PM 10/18/2018    8:29 AM 04/29/2018    9:55 AM 02/08/2017    1:08 PM  Depression screen PHQ 2/9  Decreased Interest 0 0 0 0 0  Down, Depressed, Hopeless 0 0 0 0 0  PHQ - 2 Score 0 0 0 0 0  Altered sleeping   0    Tired, decreased energy   0    Change in appetite   0    Feeling bad or failure about yourself    0    Trouble concentrating   0    Moving slowly or fidgety/restless   0    Suicidal thoughts   0    PHQ-9 Score   0    Difficult doing work/chores   Not difficult at all      The patient does not have a history of falls. I did not complete a risk assessment for falls. A plan of care for falls was not documented.   Past Medical History:  Past Medical History:  Diagnosis Date   Allergy    Cancer ([place])    melanoma    Surgical History:  Past Surgical History:  Procedure Laterality Date    DILATION AND CURETTAGE OF UTERUS     INTRAUTERINE DEVICE (IUD) INSERTION  10/11/2014   KNEE ARTHROSCOPY     MELANOMA EXCISION  06/2014   both legs   WISDOM TOOTH EXTRACTION      Medications:  Current Outpatient Medications on File Prior to Visit  Medication Sig   levonorgestrel (MIRENA) 20 MCG/24HR IUD 1 each by Intrauterine route once.   mometasone (ELOCON) 0.1 % cream Apply 1 application topically as needed.   Multiple Vitamin (MULTIVITAMIN) tablet Take 1 tablet by mouth daily.   No current facility-administered medications on file prior to visit.    Allergies:  Allergies  Allergen Reactions   Hydrocodone Hives    Social History:  Social History   Socioeconomic History   Marital status: Married    Spouse name: Not on file   Number of children: 2   Years of education: Not on file   Highest education level: Not on file  Occupational History   Occupation: Pharmacist, hospital  Tobacco Use   Smoking status: Never   Smokeless tobacco: Never  Vaping Use   Vaping Use: Never used  Substance and Sexual Activity   Alcohol use: Yes    Alcohol/week: 5.0 standard drinks of alcohol    Types: 5 Glasses of wine per week   Drug use: No   Sexual activity: Yes    Partners: Male    Birth control/protection: I.U.D.    Comment: Mirena  Other Topics Concern   Not on file  Social History Narrative   Not on file   Social Determinants of Health   Financial Resource Strain: Not on file  Food Insecurity: Not on file  Transportation Needs: Not on file  Physical Activity: Not on file  Stress: Not on file  Social Connections: Not on file  Intimate Partner Violence: Not on file   Social History   Tobacco Use  Smoking Status Never  Smokeless Tobacco Never   Social History   Substance and Sexual Activity  Alcohol Use Yes   Alcohol/week: 5.0 standard drinks of alcohol   Types: 5 Glasses of wine per week    Family History:  Family History  Problem Relation Age of Onset   Basal  cell carcinoma Father 78   Rheum arthritis Mother    Multiple myeloma Maternal Grandmother    Heart disease Maternal Grandmother        MI   Hypertension Maternal Grandmother    Diabetes Maternal Grandfather        type 1   Heart disease Maternal Grandfather    Cancer Paternal Grandfather 37       brain   Breast cancer Other 53    Past medical history, surgical history, medications, allergies, family history and social history reviewed with patient today and changes made to appropriate areas of the chart.   ROS All other ROS negative except what is listed above and in the HPI.      Objective:    BP 117/78   Pulse 77   Temp 98.5 F (36.9 C) (Oral)   Ht 5' 7.32" (1.71 m)   Wt 132 lb 9.6 oz (60.1 kg)   SpO2 97%   BMI 20.57 kg/m   Wt Readings from Last 3 Encounters:  06/24/22 132 lb 9.6 oz (60.1 kg)  09/30/21 131 lb (59.4 kg)  01/10/21 124 lb 6.4 oz (56.4 kg)    Physical Exam Vitals and nursing note reviewed. Exam conducted with a chaperone present.  Constitutional:      General: She is awake. She is not in acute distress.    Appearance: She is well-developed. She is not ill-appearing or toxic-appearing.  HENT:     Head: Normocephalic and atraumatic.     Right Ear: Hearing, tympanic membrane, ear canal and external ear normal. No drainage.     Left Ear: Hearing, tympanic membrane, ear canal and external ear normal. No drainage.     Nose: Nose normal.     Right Sinus: No maxillary sinus tenderness or frontal sinus tenderness.     Left Sinus: No maxillary sinus tenderness or frontal sinus tenderness.     Mouth/Throat:     Mouth: Mucous membranes are moist.     Pharynx: Oropharynx is clear. Uvula midline. No pharyngeal swelling, oropharyngeal exudate or posterior oropharyngeal erythema.  Eyes:     General: Lids are normal.        Right eye: No discharge.  Left eye: No discharge.     Extraocular Movements: Extraocular movements intact.     Conjunctiva/sclera:  Conjunctivae normal.     Pupils: Pupils are equal, round, and reactive to light.     Visual Fields: Right eye visual fields normal and left eye visual fields normal.  Neck:     Thyroid: No thyromegaly.     Vascular: No carotid bruit.     Trachea: Trachea normal.  Cardiovascular:     Rate and Rhythm: Normal rate and regular rhythm.     Heart sounds: Normal heart sounds. No murmur heard.    No gallop.  Pulmonary:     Effort: Pulmonary effort is normal. No accessory muscle usage or respiratory distress.     Breath sounds: Normal breath sounds.  Abdominal:     General: Bowel sounds are normal.     Palpations: Abdomen is soft. There is no hepatomegaly or splenomegaly.     Tenderness: There is no abdominal tenderness.  Musculoskeletal:        General: Normal range of motion.     Cervical back: Normal range of motion and neck supple.     Right lower leg: No edema.     Left lower leg: No edema.  Lymphadenopathy:     Head:     Right side of head: No submental, submandibular, tonsillar, preauricular or posterior auricular adenopathy.     Left side of head: No submental, submandibular, tonsillar, preauricular or posterior auricular adenopathy.     Cervical: No cervical adenopathy.  Skin:    General: Skin is warm and dry.     Capillary Refill: Capillary refill takes less than 2 seconds.     Findings: No rash.  Neurological:     Mental Status: She is alert and oriented to person, place, and time.     Gait: Gait is intact.     Deep Tendon Reflexes: Reflexes are normal and symmetric.     Reflex Scores:      Brachioradialis reflexes are 2+ on the right side and 2+ on the left side.      Patellar reflexes are 2+ on the right side and 2+ on the left side. Psychiatric:        Attention and Perception: Attention normal.        Mood and Affect: Mood normal.        Speech: Speech normal.        Behavior: Behavior normal. Behavior is cooperative.        Thought Content: Thought content normal.         Judgment: Judgment normal.    Results for orders placed or performed during the hospital encounter of 03/03/22  Surgical pathology  Result Value Ref Range   SURGICAL PATHOLOGY      SURGICAL PATHOLOGY CASE: [id number] PATIENT: Leroy Sea Surgical Pathology Report     Specimen Submitted: A. Breast, left  Clinical History: 61 year of female in which screening detected asymmetry in the outer left breast. There are two adjacent masses at 3:00 5 cm FN at posterior depth spanning 1.3 cm total which are the likely correlate for the asymmetry. Both masses sampled with one biopsy. Heart clip placed.      DIAGNOSIS: A. BREAST ASYMMETRY, LEFT OUTER; ULTRASOUND-GUIDED BIOPSY: - BENIGN BREAST TISSUE WITH 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 - NEGATIVE FOR ATYPIA AND MALIGNANCY.  GROSS DESCRIPTION: A. Labeled: Left breast 3:00 Received: Formalin Time/date in fixative: Collected and placed in  formalin at 11:08 AM on 03/03/2022 Cold ischemic time: Less than 1 minute Total fixation time: Approximately 9 hours Core pieces: 3 Size: Ranges from 0.7-1.3 cm in length and 0.1 cm in diamete r Description: Tan to yellow cores of fibrofatty tissue Ink color: Black Entirely submitted in 1 cassette.  CM 03/03/2022  Final Diagnosis performed by Quay Burow, MD.   Electronically signed 03/04/2022 10:04:36AM The electronic signature indicates that the named Attending Pathologist has evaluated the specimen Technical component performed at [facility], [address] [place], [place] [address] Lab: [phone] Dir: Rush Farmer, MD, MMM  Professional component performed at [facility], [facility], [place], [place], [place] [address] Lab: [phone] Dir: Kathi Simpers, MD       Assessment & Plan:   Problem List Items Addressed This Visit       Musculoskeletal and Integument   TMJ arthralgia     She sees dentist next week, recommend she take mouth guard as may be time for new one.  If dentist clears her then will consider physical therapy referral for jaw.  Take Tylenol as needed and Voltaren gel.  Recommend no chewing gum or hearty meats.        Other   Heart palpitations - Primary    No changes, asymptomatic.  Wishes not to have cardiology referral at this time.  Will obtain if increased episodes or other symptoms present.  Is aware to schedule appointment if any changes present.      Relevant Orders   Comprehensive metabolic panel   Lipid Panel w/o Chol/HDL Ratio   TSH   Other Visit Diagnoses     Abnormal mammogram       Repeat imaging orders placed.   Relevant Orders   MM DIAG BREAST TOMO UNI LEFT   US BREAST LTD UNI LEFT INC AXILLA   Encounter for lipid screening for cardiovascular disease       Lipid panel on labs today.   Relevant Orders   Comprehensive metabolic panel   Lipid Panel w/o Chol/HDL Ratio   Need for hepatitis C screening test       Hep C screen on labs today per guidelines for one time screening, discussed with patient.   Relevant Orders   Hepatitis C antibody   Encounter for annual physical exam       Annual physical today with labs and health maintenance reviewed, discussed with patient.   Relevant Orders   CBC with Differential/Platelet   TSH        Follow up plan: Return in about 1 year (around 06/25/2023) for Annual Physical.   LABORATORY TESTING:  - Pap smear: up to date  IMMUNIZATIONS:   - Tdap: Tetanus vaccination status reviewed: last tetanus booster within 10 years. - Influenza: Up to date - Pneumovax: Not applicable - Prevnar: Not applicable - COVID: Up to date - HPV: Not applicable - Shingrix vaccine: Not applicable  SCREENING: -Mammogram: Up to date  - Colonoscopy: Not applicable  - Bone Density: Not applicable  -Hearing Test: Not applicable  -Spirometry: Not applicable   PATIENT COUNSELING:   Advised to take 1  mg of folate supplement per day if capable of pregnancy.   Sexuality: Discussed sexually transmitted diseases, partner selection, use of condoms, avoidance of unintended pregnancy  and contraceptive alternatives.   Advised to avoid cigarette smoking.  I discussed with the patient that most people either abstain from alcohol or drink within safe limits (<=14/week and <=4 drinks/occasion for males, <=  7/weeks and <= 3 drinks/occasion for females) and that the risk for alcohol disorders and other health effects rises proportionally with the number of drinks per week and how often a drinker exceeds daily limits.  Discussed cessation/primary prevention of drug use and availability of treatment for abuse.   Diet: Encouraged to adjust caloric intake to maintain  or achieve ideal body weight, to reduce intake of dietary saturated fat and total fat, to limit sodium intake by avoiding high sodium foods and not adding table salt, and to maintain adequate dietary potassium and calcium preferably from fresh fruits, vegetables, and low-fat dairy products.    Stressed the importance of regular exercise  Injury prevention: Discussed safety belts, safety helmets, smoke detector, smoking near bedding or upholstery.   Dental health: Discussed importance of regular tooth brushing, flossing, and dental visits.    NEXT PREVENTATIVE PHYSICAL DUE IN 1 YEAR. Return in about 1 year (around 06/25/2023) for Annual Physical.

## 2022-06-24 NOTE — Assessment & Plan Note (Signed)
No changes, asymptomatic.  Wishes not to have cardiology referral at this time.  Will obtain if increased episodes or other symptoms present.  Is aware to schedule appointment if any changes present.

## 2022-06-24 NOTE — Assessment & Plan Note (Signed)
She sees dentist next week, recommend she take mouth guard as may be time for new one.  If dentist clears her then will consider physical therapy referral for jaw.  Take Tylenol as needed and Voltaren gel.  Recommend no chewing gum or hearty meats.

## 2022-06-25 ENCOUNTER — Other Ambulatory Visit: Payer: Self-pay | Admitting: Nurse Practitioner

## 2022-06-25 DIAGNOSIS — E0590 Thyrotoxicosis, unspecified without thyrotoxic crisis or storm: Secondary | ICD-10-CM

## 2022-06-25 LAB — COMPREHENSIVE METABOLIC PANEL
ALT: 27 IU/L (ref 0–32)
AST: 21 IU/L (ref 0–40)
Albumin/Globulin Ratio: 1.7 (ref 1.2–2.2)
Albumin: 4.5 g/dL (ref 3.9–4.9)
Alkaline Phosphatase: 66 IU/L (ref 44–121)
BUN/Creatinine Ratio: 27 — ABNORMAL HIGH (ref 9–23)
BUN: 16 mg/dL (ref 6–24)
Bilirubin Total: 0.3 mg/dL (ref 0.0–1.2)
CO2: 24 mmol/L (ref 20–29)
Calcium: 9.6 mg/dL (ref 8.7–10.2)
Chloride: 103 mmol/L (ref 96–106)
Creatinine, Ser: 0.6 mg/dL (ref 0.57–1.00)
Globulin, Total: 2.6 g/dL (ref 1.5–4.5)
Glucose: 90 mg/dL (ref 70–99)
Potassium: 4.3 mmol/L (ref 3.5–5.2)
Sodium: 140 mmol/L (ref 134–144)
Total Protein: 7.1 g/dL (ref 6.0–8.5)
eGFR: 113 mL/min/{1.73_m2} (ref >59)

## 2022-06-25 LAB — CBC WITH DIFFERENTIAL/PLATELET
Basophils Absolute: 0 10*3/uL (ref 0.0–0.2)
Basos: 0 %
EOS (ABSOLUTE): 0.2 10*3/uL (ref 0.0–0.4)
Eos: 3 %
Hematocrit: 39.3 % (ref 34.0–46.6)
Hemoglobin: 13.2 g/dL (ref 11.1–15.9)
Immature Grans (Abs): 0 10*3/uL (ref 0.0–0.1)
Immature Granulocytes: 0 %
Lymphocytes Absolute: 1.3 10*3/uL (ref 0.7–3.1)
Lymphs: 21 %
MCH: 29.2 pg (ref 26.6–33.0)
MCHC: 33.6 g/dL (ref 31.5–35.7)
MCV: 87 fL (ref 79–97)
Monocytes Absolute: 0.5 10*3/uL (ref 0.1–0.9)
Monocytes: 8 %
Neutrophils Absolute: 4.2 10*3/uL (ref 1.4–7.0)
Neutrophils: 68 %
Platelets: 210 10*3/uL (ref 150–450)
RBC: 4.52 x10E6/uL (ref 3.77–5.28)
RDW: 12 % (ref 11.7–15.4)
WBC: 6.1 10*3/uL (ref 3.4–10.8)

## 2022-06-25 LAB — LIPID PANEL W/O CHOL/HDL RATIO
Cholesterol, Total: 133 mg/dL (ref 100–199)
HDL: 47 mg/dL (ref >39)
LDL Chol Calc (NIH): 67 mg/dL (ref 0–99)
Triglycerides: 104 mg/dL (ref 0–149)
VLDL Cholesterol Cal: 19 mg/dL (ref 5–40)

## 2022-06-25 LAB — HEPATITIS C ANTIBODY: Hep C Virus Ab: NONREACTIVE

## 2022-06-25 LAB — TSH: TSH: <0.005 u[IU]/mL — ABNORMAL LOW (ref 0.450–4.500)

## 2022-06-25 NOTE — Progress Notes (Signed)
Contacted via [place] -- needs lab visit scheduled for next week  Good morning Kylena, your labs have returned and overall look stellar with exception of thyroid lab, this is very low (meaning more hyperactive = hyperthyroid).  This labs can fluctuate, before we send to endocrinology or obtain imaging I would like to recheck this next week via outpatient labs.  If level remain hyper then we will refer to endocrinology and obtain a thyroid ultrasound.  Hyperthyroid can cause symptoms such as anxiety, weight loss, diarrhea, cold/heat intolerance, hair changes. Any questions? Keep being amazing!!  Thank you for allowing me to participate in your care.  I appreciate you. Kindest regards, Marjie Chea

## 2022-06-26 ENCOUNTER — Other Ambulatory Visit: Payer: BC Managed Care – PPO

## 2022-07-02 ENCOUNTER — Encounter: Payer: Self-pay | Admitting: Nurse Practitioner

## 2022-07-02 ENCOUNTER — Other Ambulatory Visit: Payer: BC Managed Care – PPO

## 2022-07-02 DIAGNOSIS — E0590 Thyrotoxicosis, unspecified without thyrotoxic crisis or storm: Secondary | ICD-10-CM

## 2022-07-03 ENCOUNTER — Other Ambulatory Visit: Payer: Self-pay | Admitting: Nurse Practitioner

## 2022-07-03 ENCOUNTER — Ambulatory Visit: Payer: BC Managed Care – PPO | Admitting: Nurse Practitioner

## 2022-07-03 DIAGNOSIS — E0590 Thyrotoxicosis, unspecified without thyrotoxic crisis or storm: Secondary | ICD-10-CM

## 2022-07-03 LAB — THYROID PEROXIDASE ANTIBODY: Thyroperoxidase Ab SerPl-aCnc: 287 IU/mL — ABNORMAL HIGH (ref 0–34)

## 2022-07-03 LAB — T4, FREE: Free T4: 2.07 ng/dL — ABNORMAL HIGH (ref 0.82–1.77)

## 2022-07-03 LAB — TSH: TSH: <0.005 u[IU]/mL — ABNORMAL LOW (ref 0.450–4.500)

## 2022-07-03 NOTE — Progress Notes (Signed)
Contacted via [place] afternoon Tonya Estrada, your labs have returned.  They are continuing to show hyperthyroid (overactive) levels.  At this point I would like to get you into endocrinology for further assessment -- this may be in [place] or [place] location as currently local location is not taking new patients.  I will also order a thyroid ultrasound to further assess.  Any questions for me?  Let me know you received this message.:) Keep being amazing!!  Thank you for allowing me to participate in your care.  I appreciate you. Kindest regards, Athalia Setterlund

## 2022-07-09 ENCOUNTER — Ambulatory Visit
Admission: RE | Admit: 2022-07-09 | Discharge: 2022-07-09 | Disposition: A | Discharge status: Home / Self Care | Payer: BC Managed Care – PPO | Source: Ambulatory Visit | Attending: Nurse Practitioner | Admitting: Nurse Practitioner

## 2022-07-09 DIAGNOSIS — E0590 Thyrotoxicosis, unspecified without thyrotoxic crisis or storm: Secondary | ICD-10-CM | POA: Diagnosis present

## 2022-07-09 NOTE — Progress Notes (Signed)
Contacted via MyChart   Good evening Tonya Estrada, your thyroid ultrasound returned and I have good news, it is enlarged some which can go with current lab results we are seeing.  But there are no concerning nodules -- very important to know and good news!!  Keep your visit with endocrinology and they will help determine next steps:) Any questions? Keep being amazing!!  Thank you for allowing me to participate in your care.  I appreciate you. Kindest regards, Gerrica Cygan

## 2022-09-06 DIAGNOSIS — E0590 Thyrotoxicosis, unspecified without thyrotoxic crisis or storm: Secondary | ICD-10-CM | POA: Insufficient documentation

## 2022-09-06 DIAGNOSIS — E890 Postprocedural hypothyroidism: Secondary | ICD-10-CM | POA: Insufficient documentation

## 2022-09-06 NOTE — Patient Instructions (Signed)
Hyperthyroidism Hyperthyroidism refers to a thyroid gland that is too active or overactive. The thyroid gland is a small gland located in the lower front part of the neck, just in front of the windpipe (trachea). This gland makes hormones that: Help control how the body uses food for energy (metabolism). Help the heart and brain work well. Keep your bones strong. When the thyroid is overactive, it produces too much of a hormone called thyroxine. What are the causes? This condition may be caused by: Graves' disease. This is a disorder in which the body's disease-fighting system (immune system) attacks the thyroid gland. This is the most common cause. Inflammation of the thyroid gland. A tumor in the thyroid gland. Use of certain medicines, including: Prescription thyroid hormone replacement. Herbal supplements that mimic thyroid hormones. Amiodarone therapy. Solid or fluid-filled lumps within your thyroid gland (thyroid nodules). Taking in a large amount of iodine from foods or medicines. What increases the risk? You are more likely to develop this condition if: You are female. You have a family history of thyroid conditions. You smoke tobacco. You use a medicine called lithium. You take medicines that affect the immune system (immunosuppressants). What are the signs or symptoms? Symptoms of this condition include: Nervousness. Inability to tolerate heat. Diarrhea. Rapid heart rate. Shaky hands. Restlessness. Sleep problems. Other symptoms may include: Heart skipping beats or making extra beats. Unexplained weight loss. Change in the texture of hair or skin. Loss of menstruation. Fatigue. Enlarged thyroid gland or a lump in the thyroid (nodule). You may also have symptoms of Graves' disease, which may include: Protruding eyes. Dry eyes. Red or swollen eyes. Problems with vision. How is this diagnosed? This condition may be diagnosed based on: Your symptoms and medical  history. A physical exam. Blood tests. Thyroid ultrasound. This test involves using sound waves to produce images of the thyroid gland. A thyroid scan. A radioactive substance is injected into a vein, and images show how much iodine is present in the thyroid. Radioactive iodine uptake test (RAIU). A small amount of radioactive iodine is given by mouth to see how much iodine the thyroid absorbs after a certain amount of time. How is this treated? Treatment depends on the cause and severity of the condition. Treatment may include: Medicines to reduce the amount of thyroid hormone your body makes. Medicines to help manage your symptoms. Radioactive iodine treatment (radioiodine therapy). This involves swallowing a small dose of radioactive iodine, in capsule or liquid form, to kill thyroid cells. Surgery to remove part or all of your thyroid gland. You may need to take thyroid hormone replacement medicine for the rest of your life after thyroid surgery. Follow these instructions at home:  Take over-the-counter and prescription medicines only as told by your health care provider. Do not use any products that contain nicotine or tobacco. These products include cigarettes, chewing tobacco, and vaping devices, such as e-cigarettes. If you need help quitting, ask your health care provider. Follow any instructions from your health care provider about diet. You may be instructed to limit foods that contain iodine. Keep all follow-up visits. You will need to have blood tests regularly so that your health care provider can monitor your condition. Where to find more information National Institute of Diabetes and Digestive and Kidney Diseases: niddk.nih.gov Contact a health care provider if: Your symptoms do not get better with treatment. You have a fever. You have abdominal pain. You feel dizzy. You are taking thyroid hormone replacement medicine and: You have   symptoms of depression. You feel like you  are tired all the time. You gain weight. Get help right away if: You have sudden, unexplained confusion or other mental changes. You have chest pain. You have fast or irregular heartbeats (palpitations). You have difficulty breathing. These symptoms may be an emergency. Get help right away. Call 911. Do not wait to see if the symptoms will go away. Do not drive yourself to the hospital. Summary The thyroid gland is a small gland located in the lower front part of the neck, just in front of the windpipe. Hyperthyroidism is when the thyroid gland is too active and produces too much of a hormone called thyroxine. The most common cause is Graves' disease, a disorder in which your immune system attacks the thyroid gland. Hyperthyroidism can cause various symptoms, such as unexplained weight loss, nervousness, inability to tolerate heat, or changes in your heartbeat. Treatment may include medicine to reduce the amount of thyroid hormone your body makes, radioiodine therapy, surgery, or medicines to manage symptoms. This information is not intended to replace advice given to you by your health care provider. Make sure you discuss any questions you have with your health care provider. Document Revised: 12/19/2021 Document Reviewed: 12/19/2021 Elsevier Patient Education  2023 Elsevier Inc.  

## 2022-09-07 ENCOUNTER — Ambulatory Visit: Payer: BC Managed Care – PPO | Admitting: Nurse Practitioner

## 2022-09-07 ENCOUNTER — Encounter: Payer: Self-pay | Admitting: Nurse Practitioner

## 2022-09-07 ENCOUNTER — Ambulatory Visit
Admission: RE | Admit: 2022-09-07 | Discharge: 2022-09-07 | Disposition: A | Discharge status: Home / Self Care | Payer: BC Managed Care – PPO | Source: Ambulatory Visit | Attending: Nurse Practitioner | Admitting: Nurse Practitioner

## 2022-09-07 VITALS — BP 114/72 | HR 76 | Ht 67.32 in | Wt 130.6 lb

## 2022-09-07 VITALS — BP 90/62 | HR 76 | Temp 98.8°F | Ht 67.32 in | Wt 130.8 lb

## 2022-09-07 DIAGNOSIS — E0590 Thyrotoxicosis, unspecified without thyrotoxic crisis or storm: Secondary | ICD-10-CM | POA: Diagnosis not present

## 2022-09-07 DIAGNOSIS — R928 Other abnormal and inconclusive findings on diagnostic imaging of breast: Secondary | ICD-10-CM

## 2022-09-07 DIAGNOSIS — Z23 Encounter for immunization: Secondary | ICD-10-CM

## 2022-09-07 HISTORY — PX: OTHER SURGICAL HISTORY: SHX169

## 2022-09-07 NOTE — Patient Instructions (Signed)
Hyperthyroidism Hyperthyroidism refers to a thyroid gland that is too active or overactive. The thyroid gland is a small gland located in the lower front part of the neck, just in front of the windpipe (trachea). This gland makes hormones that: Help control how the body uses food for energy (metabolism). Help the heart and brain work well. Keep your bones strong. When the thyroid is overactive, it produces too much of a hormone called thyroxine. What are the causes? This condition may be caused by: Graves' disease. This is a disorder in which the body's disease-fighting system (immune system) attacks the thyroid gland. This is the most common cause. Inflammation of the thyroid gland. A tumor in the thyroid gland. Use of certain medicines, including: Prescription thyroid hormone replacement. Herbal supplements that mimic thyroid hormones. Amiodarone therapy. Solid or fluid-filled lumps within your thyroid gland (thyroid nodules). Taking in a large amount of iodine from foods or medicines. What increases the risk? You are more likely to develop this condition if: You are female. You have a family history of thyroid conditions. You smoke tobacco. You use a medicine called lithium. You take medicines that affect the immune system (immunosuppressants). What are the signs or symptoms? Symptoms of this condition include: Nervousness. Inability to tolerate heat. Diarrhea. Rapid heart rate. Shaky hands. Restlessness. Sleep problems. Other symptoms may include: Heart skipping beats or making extra beats. Unexplained weight loss. Change in the texture of hair or skin. Loss of menstruation. Fatigue. Enlarged thyroid gland or a lump in the thyroid (nodule). You may also have symptoms of Graves' disease, which may include: Protruding eyes. Dry eyes. Red or swollen eyes. Problems with vision. How is this diagnosed? This condition may be diagnosed based on: Your symptoms and medical  history. A physical exam. Blood tests. Thyroid ultrasound. This test involves using sound waves to produce images of the thyroid gland. A thyroid scan. A radioactive substance is injected into a vein, and images show how much iodine is present in the thyroid. Radioactive iodine uptake test (RAIU). A small amount of radioactive iodine is given by mouth to see how much iodine the thyroid absorbs after a certain amount of time. How is this treated? Treatment depends on the cause and severity of the condition. Treatment may include: Medicines to reduce the amount of thyroid hormone your body makes. Medicines to help manage your symptoms. Radioactive iodine treatment (radioiodine therapy). This involves swallowing a small dose of radioactive iodine, in capsule or liquid form, to kill thyroid cells. Surgery to remove part or all of your thyroid gland. You may need to take thyroid hormone replacement medicine for the rest of your life after thyroid surgery. Follow these instructions at home:  Take over-the-counter and prescription medicines only as told by your health care provider. Do not use any products that contain nicotine or tobacco. These products include cigarettes, chewing tobacco, and vaping devices, such as e-cigarettes. If you need help quitting, ask your health care provider. Follow any instructions from your health care provider about diet. You may be instructed to limit foods that contain iodine. Keep all follow-up visits. You will need to have blood tests regularly so that your health care provider can monitor your condition. Where to find more information National Institute of Diabetes and Digestive and Kidney Diseases: niddk.nih.gov Contact a health care provider if: Your symptoms do not get better with treatment. You have a fever. You have abdominal pain. You feel dizzy. You are taking thyroid hormone replacement medicine and: You have   symptoms of depression. You feel like you  are tired all the time. You gain weight. Get help right away if: You have sudden, unexplained confusion or other mental changes. You have chest pain. You have fast or irregular heartbeats (palpitations). You have difficulty breathing. These symptoms may be an emergency. Get help right away. Call 911. Do not wait to see if the symptoms will go away. Do not drive yourself to the hospital. Summary The thyroid gland is a small gland located in the lower front part of the neck, just in front of the windpipe. Hyperthyroidism is when the thyroid gland is too active and produces too much of a hormone called thyroxine. The most common cause is Graves' disease, a disorder in which your immune system attacks the thyroid gland. Hyperthyroidism can cause various symptoms, such as unexplained weight loss, nervousness, inability to tolerate heat, or changes in your heartbeat. Treatment may include medicine to reduce the amount of thyroid hormone your body makes, radioiodine therapy, surgery, or medicines to manage symptoms. This information is not intended to replace advice given to you by your health care provider. Make sure you discuss any questions you have with your health care provider. Document Revised: 12/19/2021 Document Reviewed: 12/19/2021 Elsevier Patient Education  2023 Elsevier Inc.  

## 2022-09-07 NOTE — Progress Notes (Signed)
BP 90/62   Pulse 76   Temp 98.8 F (37.1 C) (Oral)   Ht 5' 7.32" (1.71 m)   Wt 130 lb 12.8 oz (59.3 kg)   SpO2 97%   BMI 20.29 kg/m    Subjective:    Patient ID: Tonya Estrada, female    DOB: [birth year], 46 y.o.   MRN: [id number]  HPI: Tonya Estrada is a 46 y.o. female  Chief Complaint  Patient presents with   Hyperthyroidism    Patient is here for eight week follow up on Hyperthyroidism. Patient denies having any concerns at today's visit.    HYPERTHYROIDISM Noted initially on labs 06/24/22 with repeat remaining hyper and antibody elevated.  Seen by endocrinology today with plan for further work-up, labs and imaging, to determine next steps for treatment. Thyroid control status:uncontrolled Etiology of hypothyroidism: autoimmune Fatigue: no Cold intolerance: no Heat intolerance: no Weight gain: no Weight loss: no Constipation: no Diarrhea/loose stools: no Palpitations: yes Lower extremity edema: no Anxiety/depressed mood: no   Relevant past medical, surgical, family and social history reviewed and updated as indicated. Interim medical history since our last visit reviewed. Allergies and medications reviewed and updated.  Review of Systems  Constitutional:  Negative for activity change, appetite change, diaphoresis, fatigue and fever.  Respiratory:  Negative for cough, chest tightness and shortness of breath.   Cardiovascular:  Negative for chest pain, palpitations and leg swelling.  Gastrointestinal: Negative.   Endocrine: Negative for cold intolerance and heat intolerance.  Neurological: Negative.   Psychiatric/Behavioral: Negative.      Per HPI unless specifically indicated above     Objective:    BP 90/62   Pulse 76   Temp 98.8 F (37.1 C) (Oral)   Ht 5' 7.32" (1.71 m)   Wt 130 lb 12.8 oz (59.3 kg)   SpO2 97%   BMI 20.29 kg/m   Wt Readings from Last 3 Encounters:  09/07/22 130 lb 12.8 oz (59.3 kg)  09/07/22 130 lb 9.6 oz (59.2 kg)   06/24/22 132 lb 9.6 oz (60.1 kg)    Physical Exam Vitals and nursing note reviewed.  Constitutional:      General: She is awake. She is not in acute distress.    Appearance: She is well-developed and well-groomed. She is not ill-appearing or toxic-appearing.  HENT:     Head: Normocephalic.     Right Ear: Hearing normal.     Left Ear: Hearing normal.  Eyes:     General: Lids are normal.        Right eye: No discharge.        Left eye: No discharge.     Conjunctiva/sclera: Conjunctivae normal.     Pupils: Pupils are equal, round, and reactive to light.  Neck:     Thyroid: No thyromegaly.     Vascular: No carotid bruit.  Cardiovascular:     Rate and Rhythm: Normal rate and regular rhythm.     Heart sounds: Normal heart sounds. No murmur heard.    No gallop.  Pulmonary:     Effort: Pulmonary effort is normal. No accessory muscle usage or respiratory distress.     Breath sounds: Normal breath sounds.  Abdominal:     General: Bowel sounds are normal.     Palpations: Abdomen is soft. There is no hepatomegaly or splenomegaly.  Musculoskeletal:     Cervical back: Normal range of motion and neck supple.     Right lower leg: No edema.  Left lower leg: No edema.  Lymphadenopathy:     Cervical: No cervical adenopathy.  Skin:    General: Skin is warm and dry.  Neurological:     Mental Status: She is alert and oriented to person, place, and time.  Psychiatric:        Attention and Perception: Attention normal.        Mood and Affect: Mood normal.        Speech: Speech normal.        Behavior: Behavior normal. Behavior is cooperative.        Thought Content: Thought content normal.     Results for orders placed or performed in visit on 07/02/22  TSH  Result Value Ref Range   TSH <0.005 (L) 0.450 - 4.500 uIU/mL  Thyroid peroxidase antibody  Result Value Ref Range   Thyroperoxidase Ab SerPl-aCnc 287 (H) 0 - 34 IU/mL  T4, free  Result Value Ref Range   Free T4 2.07 (H)  0.82 - 1.77 ng/dL      Assessment & Plan:   Problem List Items Addressed This Visit       Endocrine   Hyperthyroidism - Primary    Diagnosed on 07/02/22.  Currently had initial visit with endocrinology, note reviewed.  Will continue to follow along with them and plan on return to office in 6 months for follow-up.        Follow up plan: Return in about 6 months (around 03/09/2023) for Hyperthyroid.

## 2022-09-07 NOTE — Assessment & Plan Note (Signed)
Diagnosed on 07/02/22.  Currently had initial visit with endocrinology, note reviewed.  Will continue to follow along with them and plan on return to office in 6 months for follow-up.

## 2022-09-07 NOTE — Progress Notes (Signed)
09/07/2022     Endocrinology Consult Note    Subjective:    Patient ID: Tonya Estrada, female    DOB: [birth year], PCP Tonya Lick, NP.   Past Medical History:  Diagnosis Date   Allergy    Cancer ([place])    melanoma    Past Surgical History:  Procedure Laterality Date   DILATION AND CURETTAGE OF UTERUS     INTRAUTERINE DEVICE (IUD) INSERTION  10/11/2014   KNEE ARTHROSCOPY     MELANOMA EXCISION  06/2014   both legs   WISDOM TOOTH EXTRACTION      Social History   Socioeconomic History   Marital status: Married    Spouse name: Not on file   Number of children: 2   Years of education: Not on file   Highest education level: Not on file  Occupational History   Occupation: Teacher  Tobacco Use   Smoking status: Never   Smokeless tobacco: Never  Vaping Use   Vaping Use: Never used  Substance and Sexual Activity   Alcohol use: Yes    Alcohol/week: 5.0 standard drinks of alcohol    Types: 5 Glasses of wine per week   Drug use: No   Sexual activity: Yes    Partners: Male    Birth control/protection: I.U.D.    Comment: Mirena  Other Topics Concern   Not on file  Social History Narrative   Not on file   Social Determinants of Health   Financial Resource Strain: Not on file  Food Insecurity: Not on file  Transportation Needs: Not on file  Physical Activity: Not on file  Stress: Not on file  Social Connections: Not on file    Family History  Problem Relation Age of Onset   Basal cell carcinoma Father 41   Rheum arthritis Mother    Multiple myeloma Maternal Grandmother    Heart disease Maternal Grandmother        MI   Hypertension Maternal Grandmother    Diabetes Maternal Grandfather        type 1   Heart disease Maternal Grandfather    Cancer Paternal Grandfather 53       brain   Breast cancer Other 69    Outpatient Encounter Medications as of 09/07/2022  Medication Sig   levonorgestrel (MIRENA) 20 MCG/24HR IUD 1 each by  Intrauterine route once.   loratadine (CLARITIN) 10 MG tablet Take 10 mg by mouth as needed for allergies.   mometasone (ELOCON) 0.1 % cream Apply 1 application topically as needed.   Multiple Vitamin (MULTIVITAMIN) tablet Take 1 tablet by mouth daily.   No facility-administered encounter medications on file as of 09/07/2022.    ALLERGIES: Allergies  Allergen Reactions   Hydrocodone Hives    VACCINATION STATUS: Immunization History  Administered Date(s) Administered   DTaP 08/18/1977, 09/17/1977, 12/15/1977, 07/21/1979, 02/08/1986   Hepatitis B 06/07/1996, 07/13/1996, 12/14/1996   IPV 08/18/1977, 09/17/1977, 12/15/1977, 07/21/1979   Influenza,inj,Quad PF,6+ Mos 09/09/2015, 08/02/2019   Influenza,inj,quad, With Preservative 08/25/2017   Influenza-Unspecified 08/28/2016, 08/24/2017, 08/17/2018, 08/20/2020, 09/06/2021   MMR 11/18/1978, 07/19/1992   Moderna SARS-COV2 Booster Vaccination 09/18/2021   Moderna Sars-Covid-2 Vaccination 01/06/2020, 02/03/2020, 08/31/2020   Tdap 03/08/2009, 12/06/2019     HPI  Tonya Estrada is 46 y.o. female who presents today with a medical history as above. she is being seen in consultation for hyperthyroidism requested by Tonya Guarneri T, NP.  she has been dealing with symptoms of intermittent palpitations for  5-10 years. These symptoms are progressively worsening and troubling to her.  her most recent thyroid labs revealed suppressed TSH of < 0.005 and high Free T4 of 2.07 on 07/02/22.  Her TPO antibodies were also high at 287. she denies dysphagia, choking, shortness of breath, no recent voice change.    she does have family history of thyroid dysfunction in her mom (hypothyroidism) and strong family history of RA and type 1 diabetes, but denies family hx of thyroid cancer. she denies personal history of goiter. she is not on any anti-thyroid medications nor on any thyroid hormone supplements. Denies use of Biotin containing supplements.  she is  willing to proceed with appropriate work up and therapy for thyrotoxicosis.   Review of systems  Constitutional: + Minimally fluctuating body weight, current Body mass index is 20.26 kg/m., no fatigue, no subjective hyperthermia, no subjective hypothermia Eyes: no blurry vision, no xerophthalmia ENT: no sore throat, no nodules palpated in throat, no dysphagia/odynophagia, no hoarseness Cardiovascular: no chest pain, no shortness of breath, + intermittent palpitations, no leg swelling Respiratory: no cough, no shortness of breath Gastrointestinal: no nausea/vomiting/diarrhea Musculoskeletal: no muscle/joint aches Skin: no rashes, no hyperemia Neurological: no tremors, no numbness, no tingling, no dizziness Psychiatric: no depression, no anxiety   Objective:    BP 114/72 (BP Location: Left Arm, Patient Position: Sitting, Cuff Size: Normal)   Pulse 76   Ht 5' 7.32" (1.71 m)   Wt 130 lb 9.6 oz (59.2 kg)   BMI 20.26 kg/m   Wt Readings from Last 3 Encounters:  09/07/22 130 lb 9.6 oz (59.2 kg)  06/24/22 132 lb 9.6 oz (60.1 kg)  09/30/21 131 lb (59.4 kg)     BP Readings from Last 3 Encounters:  09/07/22 114/72  06/24/22 117/78  09/30/21 120/60                          Physical Exam- Limited  Constitutional:  Body mass index is 20.26 kg/m. , not in acute distress, normal state of mind Eyes:  EOMI, no exophthalmos Neck: Supple Thyroid: mild thyromegaly Cardiovascular: RRR, no murmurs, rubs, or gallops, no edema Respiratory: Adequate breathing efforts, no crackles, rales, rhonchi, or wheezing Musculoskeletal: no gross deformities, strength intact in all four extremities, no gross restriction of joint movements Skin:  no rashes, no hyperemia Neurological: slight tremor with outstretched hands, DTR normal in BLE   CMP     Component Value Date/Time   NA 140 06/24/2022 1523   K 4.3 06/24/2022 1523   CL 103 06/24/2022 1523   CO2 24 06/24/2022 1523   GLUCOSE 90 06/24/2022  1523   BUN 16 06/24/2022 1523   CREATININE 0.60 06/24/2022 1523   CALCIUM 9.6 06/24/2022 1523   PROT 7.1 06/24/2022 1523   ALBUMIN 4.5 06/24/2022 1523   AST 21 06/24/2022 1523   ALT 27 06/24/2022 1523   ALKPHOS 66 06/24/2022 1523   BILITOT 0.3 06/24/2022 1523   GFRNONAA 108 10/18/2018 0857   GFRAA 125 10/18/2018 0857     CBC    Component Value Date/Time   WBC 6.1 06/24/2022 1523   RBC 4.52 06/24/2022 1523   HGB 13.2 06/24/2022 1523   HCT 39.3 06/24/2022 1523   PLT 210 06/24/2022 1523   MCV 87 06/24/2022 1523   MCH 29.2 06/24/2022 1523   MCHC 33.6 06/24/2022 1523   RDW 12.0 06/24/2022 1523   LYMPHSABS 1.3 06/24/2022 1523   EOSABS 0.2 06/24/2022 1523  BASOSABS 0.0 06/24/2022 1523     Diabetic Labs (most recent): No results found for: "HGBA1C", "MICROALBUR"  Lipid Panel     Component Value Date/Time   CHOL 133 06/24/2022 1523   TRIG 104 06/24/2022 1523   HDL 47 06/24/2022 1523   LDLCALC 67 06/24/2022 1523   LABVLDL 19 06/24/2022 1523     Lab Results  Component Value Date   TSH <0.005 (L) 07/02/2022   TSH <0.005 (L) 06/24/2022   TSH 1.040 10/18/2018   TSH 1.290 09/09/2015   FREET4 2.07 (H) 07/02/2022        Assessment & Plan:   1. Hyperthyroidism- r/Estrada Graves disease  she is being seen at a kind request of Cannady, Jolene T, NP.  her history and most recent labs are reviewed, and she was examined clinically. Subjective and objective findings are consistent with thyrotoxicosis likely from primary hyperthyroidism. The potential risks of untreated thyrotoxicosis and the need for definitive therapy have been discussed in detail with her, and she agrees to proceed with diagnostic workup and treatment plan.   I will repeat full profile thyroid function tests today and confirmatory thyroid uptake and scan will be scheduled to be done as soon as possible.  Her antibody testing was positive indicating she does have genetic predisposition for thyroid dysfunction  due to autoimmune disease.   Options of therapy are discussed with her.  We discussed the option of treating it with medications including methimazole or PTU which may have side effects including rash, transaminitis, and bone marrow suppression.  We also discussed the option of definitive therapy with RAI ablation of the thyroid. If she is found to have primary hyperthyroidism from Graves' disease , toxic multinodular goiter or toxic nodular goiter the preferred modality of treatment would be I-131 thyroid ablation. Surgery is another choice of treatment in some cases, in her case surgery is not a good fit for presentation with only mild goiter.  -Patient is made aware of the high likelihood of post ablative hypothyroidism with subsequent need for lifelong thyroid hormone replacement. sheunderstands this outcome and she is  willing to proceed.      she will return in 4 weeks for treatment decision.  I did not start her on any new medications today.  Her HR was stable.   -Patient is advised to maintain close follow up with Tonya Lick, NP for primary care needs.   - Time spent with the patient: 60 minutes, of which >50% was spent in obtaining information about her symptoms, reviewing her previous labs, evaluations, and treatments, counseling her about her hyperthyroidism , and developing a plan to confirm the diagnosis and long term treatment as necessary. Please refer to "Patient Self Inventory" in the Media tab for reviewed elements of pertinent patient history.  Everlean Alstrom participated in the discussions, expressed understanding, and voiced agreement with the above plans.  All questions were answered to her satisfaction. she is encouraged to contact clinic should she have any questions or concerns prior to her return visit.   Follow up plan: Return in about 4 weeks (around 10/05/2022) for Thyroid follow up, Previsit labs, uptake and scan.   Thank you for involving me in the care  of this pleasant patient, and I will continue to update you with her progress.    Rayetta Pigg, [facility] [address] [place], [place] [address] Phone: [phone] Fax: [phone]  09/07/2022, 10:58 AM

## 2022-09-08 ENCOUNTER — Telehealth: Payer: Self-pay | Admitting: *Deleted

## 2022-09-08 LAB — TSH: TSH: <0.005 u[IU]/mL — ABNORMAL LOW (ref 0.450–4.500)

## 2022-09-08 LAB — T3, FREE: T3, Free: 5.1 pg/mL — ABNORMAL HIGH (ref 2.0–4.4)

## 2022-09-08 LAB — T4, FREE: Free T4: 1.88 ng/dL — ABNORMAL HIGH (ref 0.82–1.77)

## 2022-09-08 NOTE — Progress Notes (Signed)
Her thyroid blood work shows she still has an over-active thyroid gland, but has improved somewhat since last labs were drawn.  I still recommend moving forward with the uptake and scan as soon as possible to avoid delay in treatment.  She does not need any medication to slow her thyroid down in the interim.

## 2022-09-08 NOTE — Telephone Encounter (Signed)
-----   Message from Brita Romp, NP sent at 09/08/2022  7:24 AM EDT ----- Her thyroid blood work shows she still has an over-active thyroid gland, but has improved somewhat since last labs were drawn.  I still recommend moving forward with the uptake and scan as soon as possible to avoid delay in treatment.  She does not n eed any medication to slow her thyroid down in the interim.

## 2022-09-08 NOTE — Telephone Encounter (Signed)
Ask patient to call the office for test results, if I do not hear from her later this morning I will call her back.

## 2022-09-09 ENCOUNTER — Telehealth: Payer: Self-pay | Admitting: Nurse Practitioner

## 2022-09-09 NOTE — Telephone Encounter (Signed)
Patient returned call to the office , she was given her results per Rayetta Pigg, NP. Patient states that she already has appointment next week at [facility] for 11/09 and 11/10 for scans

## 2022-09-09 NOTE — Telephone Encounter (Signed)
Pt is calling you back for her tests results

## 2022-09-09 NOTE — Telephone Encounter (Signed)
-----   Message from Brita Romp, NP sent at 09/08/2022  7:24 AM EDT ----- Her thyroid blood work shows she still has an over-active thyroid gland, but has improved somewhat since last labs were drawn.  I still recommend moving forward with the uptake and scan as soon as possible to avoid delay in treatment.  She does not n eed any medication to slow her thyroid down in the interim.

## 2022-09-09 NOTE — Progress Notes (Signed)
Patient returned call to the office , she was given her results per Rayetta Pigg, NP. Patient states that she already has appointment next week at [facility] for 11/09 and 11/10 for scans.

## 2022-09-17 ENCOUNTER — Encounter
Admission: RE | Admit: 2022-09-17 | Discharge: 2022-09-17 | Disposition: A | Discharge status: Home / Self Care | Payer: BC Managed Care – PPO | Source: Ambulatory Visit | Attending: Nurse Practitioner | Admitting: Nurse Practitioner

## 2022-09-17 DIAGNOSIS — E0590 Thyrotoxicosis, unspecified without thyrotoxic crisis or storm: Secondary | ICD-10-CM | POA: Diagnosis present

## 2022-09-17 MED ORDER — SODIUM IODIDE I-123 7.4 MBQ CAPS
317.0000 | ORAL_CAPSULE | Freq: Once | ORAL | Status: AC
  Administered 2022-09-17: 317 via ORAL

## 2022-09-18 ENCOUNTER — Encounter
Admission: RE | Admit: 2022-09-18 | Discharge: 2022-09-18 | Disposition: A | Discharge status: Home / Self Care | Payer: BC Managed Care – PPO | Source: Ambulatory Visit | Attending: Nurse Practitioner | Admitting: Nurse Practitioner

## 2022-10-09 NOTE — Patient Instructions (Incomplete)
Radioiodine (I-131) Therapy for Hyperthyroidism  Radioiodine (I-131) therapy is a treatment for an overactive thyroid gland (hyperthyroidism). The thyroid is a gland in the neck that makes hormones that affect how the body processes food for energy (metabolism) and how the heart and brain function. This treatment involves swallowing a pill or liquid that contains I-131. I-131 is manufactured (synthetic) iodine that gives off radiation. After it is swallowed, the I-131 will be absorbed by the thyroid gland over the next few months. It will destroy thyroid cells and reverse hyperthyroidism. Tell a health care provider about: Any allergies you have. All medicines you are taking, including vitamins, herbs, eye drops, creams, and over-the-counter medicines. Any bleeding problems you have. Any surgeries you have had. Any medical conditions you have. Whether you are pregnant, may be pregnant, or have gone through menopause. The health care provider will also want to know: Whether you are breastfeeding. Whether you plan to have children in the next 2 years. Any contact you have with children or pregnant women. Your travel plans for the next 3 months. Whether you pass through radiation detectors for work or travel. What are the risks? Generally, this is a safe procedure. However, problems may occur, including: Damage to other structures or organs, such as the salivary glands. This could lead to dry mouth and loss of taste. Low sperm count. This may lead to temporary infertility. Sore throat or neck pain. This is temporary. Slightly increased risk of thyroid cancer. Nausea or vomiting. Hypothyroidism. This is a condition in which the thyroid gland does not make enough thyroid hormone. Radiation thyroiditis. This is the swelling of the thyroid gland that is caused by the radioiodine therapy. What happens before the procedure? When to stop eating and drinking Follow instructions from your health care  provider about what you may eat and drink. These may include: 2 hours before your procedure Stop eating all foods. Stop drinking all liquids. You may be allowed to take medicines with small sips of water. If you do not follow your health care provider's instructions, your procedure may be delayed or canceled. Medicines Ask your health care provider about: Changing or stopping your regular medicines. These include any diabetes medicines, blood thinners, or thyroid medicines. Taking over-the-counter medicines, vitamins, herbs, and supplements. General instructions Women may be asked to take a pregnancy test. Women who are breastfeeding should: Plan to stop at least 6 weeks before the procedure. Not go back to breastfeeding after the procedure until their health care provider approves. Plan to avoid contact with other people for 1 week after your treatment to protect others from exposure to radiation as it leaves your body. Avoiding contact with children and pregnant women is especially important. To do this, plan to stay home from work, arrange child care, and sleep alone. Plan to drive yourself home after treatment. Do not take public transportation. If you need someone to drive you home, sit as far away from the driver as possible. What happens during the procedure? You will be given a dose of I-131 to swallow. It may be a capsule or a liquid. Your thyroid gland will absorb the I-131 over the next 3 months. The treatment process will be complete in about 6 months. What happens after the procedure? You may need to stay in the hospital for 24 hours after your treatment. This depends on the requirements in your state. Follow instructions from your health care provider about: How to take care of yourself after the procedure. How to  protect others from exposure to radiation as it leaves your body. Summary Radioiodine (I-131) therapy is a treatment for an overactive thyroid gland  (hyperthyroidism). This treatment involves swallowing a capsule or liquid that contains I-131. I-131 is manufactured iodine that gives off radiation. Your thyroid gland will absorb the I-131 over the next 3 months. The I-131 destroys thyroid cells and reverses hyperthyroidism. Follow instructions from your health care provider about how to take care of yourself and how to protect other people from exposure to radiation after the procedure. This information is not intended to replace advice given to you by your health care provider. Make sure you discuss any questions you have with your health care provider. Document Revised: 12/19/2021 Document Reviewed: 12/19/2021 Elsevier Patient Education  2023 Elsevier Inc.      Wait to have your labs drawn until 6 weeks after the ablation to assess response to treatment.

## 2022-10-12 ENCOUNTER — Ambulatory Visit: Payer: BC Managed Care – PPO | Admitting: Nurse Practitioner

## 2022-10-12 ENCOUNTER — Encounter: Payer: Self-pay | Admitting: Nurse Practitioner

## 2022-10-12 VITALS — BP 104/71 | HR 84 | Ht 67.32 in | Wt 128.2 lb

## 2022-10-12 DIAGNOSIS — E0590 Thyrotoxicosis, unspecified without thyrotoxic crisis or storm: Secondary | ICD-10-CM | POA: Diagnosis not present

## 2022-10-12 NOTE — Progress Notes (Signed)
10/12/2022     Endocrinology Follow Up Note    Subjective:    Patient ID: Tonya Estrada, female    DOB: June 29, 1976, PCP Venita Lick, NP.   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 - NEGATIVE FOR ATYPIA AND MALIGNANCY.   DILATION AND CURETTAGE OF UTERUS     INTRAUTERINE DEVICE (IUD) INSERTION  10/11/2014   KNEE ARTHROSCOPY     MELANOMA EXCISION  06/2014   both legs   WISDOM TOOTH EXTRACTION      Social History   Socioeconomic History   Marital status: Married    Spouse name: Not on file   Number of children: 2   Years of education: Not on file   Highest education level: Not on file  Occupational History   Occupation: Teacher  Tobacco Use   Smoking status: Never   Smokeless tobacco: Never  Vaping Use   Vaping Use: Never used  Substance and Sexual Activity   Alcohol use: Yes    Alcohol/week: 5.0 standard drinks of alcohol    Types: 5 Glasses of wine per week   Drug use: No   Sexual activity: Yes    Partners: Male    Birth control/protection: I.U.D.    Comment: Mirena  Other Topics Concern   Not on file  Social History Narrative   Not on file   Social Determinants of Health   Financial Resource Strain: Not on file  Food Insecurity: Not on file  Transportation Needs: Not on file  Physical Activity: Not on file  Stress: Not on file  Social Connections: Not on file    Family History  Problem Relation Age of Onset   Basal cell carcinoma Father 7   Rheum arthritis Mother    Multiple myeloma Maternal Grandmother    Heart disease Maternal Grandmother        MI   Hypertension Maternal Grandmother    Diabetes Maternal Grandfather        type 1   Heart disease Maternal Grandfather    Cancer Paternal Grandfather 39        brain   Breast cancer Other 59    Outpatient Encounter Medications as of 10/12/2022  Medication Sig   levonorgestrel (MIRENA) 20 MCG/24HR IUD 1 each by Intrauterine route once.   loratadine (CLARITIN) 10 MG tablet Take 10 mg by mouth as needed for allergies.   mometasone (ELOCON) 0.1 % cream Apply 1 application topically as needed.   Multiple Vitamin (MULTIVITAMIN) tablet Take 1 tablet by mouth daily.   No facility-administered encounter medications on file as of 10/12/2022.    ALLERGIES: Allergies  Allergen Reactions   Hydrocodone Hives    VACCINATION STATUS: Immunization History  Administered Date(s) Administered   DTaP 08/18/1977, 09/17/1977, 12/15/1977, 07/21/1979, 02/08/1986   Hepatitis B 06/07/1996, 07/13/1996, 12/14/1996   IPV 08/18/1977, 09/17/1977, 12/15/1977, 07/21/1979   Influenza,inj,Quad PF,6+ Mos 09/09/2015, 08/02/2019   Influenza,inj,quad, With Preservative 08/25/2017   Influenza-Unspecified 08/28/2016, 08/24/2017, 08/17/2018, 08/20/2020, 09/06/2021, 08/25/2022   MMR 11/18/1978, 07/19/1992   Moderna SARS-COV2 Booster Vaccination 09/18/2021   Moderna Sars-Covid-2 Vaccination 01/06/2020, 02/03/2020, 08/31/2020, 08/25/2022   Tdap 03/08/2009, 12/06/2019     HPI  Tonya Estrada is 46 y.o. female who presents today with a medical  history as above. she is being seen in follow up after being seen in consultation for hyperthyroidism requested by Marnee Guarneri T, NP.  she has been dealing with symptoms of intermittent palpitations for 5-10 years. These symptoms are progressively worsening and troubling to her.  her most recent thyroid labs revealed suppressed TSH of < 0.005 and high Free T4 of 2.07 on 07/02/22.  Her TPO antibodies were also high at 287. she denies dysphagia, choking, shortness of breath, no recent voice change.    she does have family history of thyroid dysfunction in her mom (hypothyroidism) and strong family history of RA and type 1 diabetes, but  denies family hx of thyroid cancer. she denies personal history of goiter. she is not on any anti-thyroid medications nor on any thyroid hormone supplements. Denies use of Biotin containing supplements.  she is willing to proceed with appropriate work up and therapy for thyrotoxicosis.   Review of systems  Constitutional: + Minimally fluctuating body weight, current Body mass index is 19.89 kg/m., no fatigue, no subjective hyperthermia, no subjective hypothermia Eyes: no blurry vision, no xerophthalmia ENT: no sore throat, no nodules palpated in throat, no dysphagia/odynophagia, no hoarseness Cardiovascular: no chest pain, no shortness of breath, + intermittent palpitations, no leg swelling Respiratory: no cough, no shortness of breath Gastrointestinal: no nausea/vomiting/diarrhea Musculoskeletal: no muscle/joint aches Skin: no rashes, no hyperemia Neurological: no tremors, no numbness, no tingling, no dizziness Psychiatric: no depression, no anxiety   Objective:    BP 104/71 (BP Location: Left Arm, Patient Position: Sitting, Cuff Size: Normal)   Pulse 84   Ht 5' 7.32" (1.71 m)   Wt 128 lb 3.2 oz (58.2 kg)   BMI 19.89 kg/m   Wt Readings from Last 3 Encounters:  10/12/22 128 lb 3.2 oz (58.2 kg)  09/07/22 130 lb 12.8 oz (59.3 kg)  09/07/22 130 lb 9.6 oz (59.2 kg)     BP Readings from Last 3 Encounters:  10/12/22 104/71  09/07/22 90/62  09/07/22 114/72                          Physical Exam- Limited  Constitutional:  Body mass index is 19.89 kg/m. , not in acute distress, normal state of mind Eyes:  EOMI, no exophthalmos Neck: Supple Thyroid: mild thyromegaly Cardiovascular: RRR, no murmurs, rubs, or gallops, no edema Respiratory: Adequate breathing efforts, no crackles, rales, rhonchi, or wheezing Musculoskeletal: no gross deformities, strength intact in all four extremities, no gross restriction of joint movements Skin:  no rashes, no hyperemia Neurological: slight  tremor with outstretched hands, DTR normal in BLE   CMP     Component Value Date/Time   NA 140 06/24/2022 1523   K 4.3 06/24/2022 1523   CL 103 06/24/2022 1523   CO2 24 06/24/2022 1523   GLUCOSE 90 06/24/2022 1523   BUN 16 06/24/2022 1523   CREATININE 0.60 06/24/2022 1523   CALCIUM 9.6 06/24/2022 1523   PROT 7.1 06/24/2022 1523   ALBUMIN 4.5 06/24/2022 1523   AST 21 06/24/2022 1523   ALT 27 06/24/2022 1523   ALKPHOS 66 06/24/2022 1523   BILITOT 0.3 06/24/2022 1523   GFRNONAA 108 10/18/2018 0857   GFRAA 125 10/18/2018 0857     CBC    Component Value Date/Time   WBC 6.1 06/24/2022 1523   RBC 4.52 06/24/2022 1523   HGB 13.2 06/24/2022 1523   HCT 39.3 06/24/2022 1523   PLT 210 06/24/2022 1523  MCV 87 06/24/2022 1523   MCH 29.2 06/24/2022 1523   MCHC 33.6 06/24/2022 1523   RDW 12.0 06/24/2022 1523   LYMPHSABS 1.3 06/24/2022 1523   EOSABS 0.2 06/24/2022 1523   BASOSABS 0.0 06/24/2022 1523     Diabetic Labs (most recent): No results found for: "HGBA1C", "MICROALBUR"  Lipid Panel     Component Value Date/Time   CHOL 133 06/24/2022 1523   TRIG 104 06/24/2022 1523   HDL 47 06/24/2022 1523   LDLCALC 67 06/24/2022 1523   LABVLDL 19 06/24/2022 1523     Lab Results  Component Value Date   TSH <0.005 (L) 09/07/2022   TSH <0.005 (L) 07/02/2022   TSH <0.005 (L) 06/24/2022   TSH 1.040 10/18/2018   TSH 1.290 09/09/2015   FREET4 1.88 (H) 09/07/2022   FREET4 2.07 (H) 07/02/2022     Latest Reference Range & Units 09/09/15 16:20 10/18/18 08:57 06/24/22 15:23 07/02/22 08:25 09/07/22 10:17  TSH 0.450 - 4.500 uIU/mL 1.290 1.040 <0.005 (L) <0.005 (L) <0.005 (L)  Triiodothyronine,Free,Serum 2.0 - 4.4 pg/mL     5.1 (H)  T4,Free(Direct) 0.82 - 1.77 ng/dL    2.07 (H) 1.88 (H)  Thyroperoxidase Ab SerPl-aCnc 0 - 34 IU/mL    287 (H)   (L): Data is abnormally low (H): Data is abnormally high    Uptake and Scan from 09/18/22 CLINICAL DATA:  Hyperthyroidism, TSH < 0.005    EXAM: THYROID SCAN AND UPTAKE - 4 AND 24 HOURS   TECHNIQUE: Following oral administration of I-123 capsule, anterior planar imaging was acquired at 24 hours. Thyroid uptake was calculated with a thyroid probe at 4-6 hours and 24 hours.   RADIOPHARMACEUTICALS:  317 uCi I-123 sodium iodide p.o.   COMPARISON:  Thyroid ultrasound 07/09/2022   FINDINGS: Homogeneous tracer distribution throughout both thyroid lobes, slightly larger RIGHT lobe than LEFT. No focal areas of increased or decreased tracer localization seen.   4 hour I-123 uptake = 19.9% (normal 5-20%)   24 hour I-123 uptake = 34.1% (normal 10-30%)   IMPRESSION: Mildly elevated 24 hour radio iodine uptakes with homogeneous tracer distribution in both thyroid lobes.   Findings consistent with Graves disease.     Electronically Signed   By: Lavonia Dana M.D.   On: 09/18/2022 09:28  Assessment & Plan:   1. Hyperthyroidism- r/t Graves disease  she is being seen at a kind request of Cannady, Jolene T, NP.  her history and most recent labs are reviewed, and she was examined clinically. Subjective and objective findings are consistent with thyrotoxicosis likely from primary hyperthyroidism. The potential risks of untreated thyrotoxicosis and the need for definitive therapy have been discussed in detail with her, and she agrees to proceed with diagnostic workup and treatment plan.   Her antibody testing was positive indicating she does have genetic predisposition for thyroid dysfunction due to autoimmune disease.  Her uptake and scan shows mild elevation in 24 hour uptake, consistent with Graves disease.  We discussed RAI ablation vs Methimazole and decided to proceed with RAI ablation.   She will need to have thyroid labs about 7 weeks after the ablation to assess response to treatment and help judge timing of initiation of thyroid hormone replacement therapy.   -Patient is advised to maintain close follow up with  Venita Lick, NP for primary care needs.   I spent 25 minutes in the care of the patient today including review of labs from Thyroid Function, CMP, and other relevant labs ;  imaging/biopsy records (current and previous including abstractions from other facilities); face-to-face time discussing  her lab results and symptoms, medications doses, her options of short and long term treatment based on the latest standards of care / guidelines;   and documenting the encounter.  Everlean Alstrom  participated in the discussions, expressed understanding, and voiced agreement with the above plans.  All questions were answered to her satisfaction. she is encouraged to contact clinic should she have any questions or concerns prior to her return visit.  Follow up plan: Return in about 2 months (around 12/13/2022) for Thyroid follow up, RAI ablation with labs 6 weeks post procedure.   Thank you for involving me in the care of this pleasant patient, and I will continue to update you with her progress.   Rayetta Pigg, [facility] [address] [place], [place] [address] Phone: [phone] Fax: [phone]  10/12/2022, 9:58 AM

## 2022-10-13 NOTE — Written Directive (Cosign Needed)
MOLECULAR IMAGING AND THERAPEUTICS WRITTEN DIRECTIVE   PATIENT NAME: Tonya Estrada  PT DOB:   1976-03-15                                              MRN: [id number]  ---------------------------------------------------------------------------------------------------------------------   I-131 WHOLE THYROID THERAPY (NON-CANCER)    RADIOPHARMACEUTICAL:   Iodine-131 Capsule    PRESCRIBED DOSE FOR ADMINISTRATION: 21 mCi   ROUTE OFADMINISTRATION: PO   DIAGNOSIS:  Hyperthyroidism Berenice Primas Disease   REFERRING PHYSICIAN: Reardon,Whitney Zayquan Bogard, NP   TSH:    Lab Results  Component Value Date   TSH <0.005 (L) 09/07/2022   TSH <0.005 (L) 07/02/2022   TSH <0.005 (L) 06/24/2022     PRIOR I-131 THERAPY (Date and Dose):    PRIOR RADIOLOGY EXAMS (Results and Date): NM THYROID MULT UPTAKE W/IMAGING  Result Date: 09/18/2022 CLINICAL DATA:  Hyperthyroidism, TSH < 0.005 EXAM: THYROID SCAN AND UPTAKE - 4 AND 24 HOURS TECHNIQUE: Following oral administration of I-123 capsule, anterior planar imaging was acquired at 24 hours. Thyroid uptake was calculated with a thyroid probe at 4-6 hours and 24 hours. RADIOPHARMACEUTICALS:  317 uCi I-123 sodium iodide p.o. COMPARISON:  Thyroid ultrasound 07/09/2022 FINDINGS: Homogeneous tracer distribution throughout both thyroid lobes, slightly larger RIGHT lobe than LEFT. No focal areas of increased or decreased tracer localization seen. 4 hour I-123 uptake = 19.9% (normal 5-20%) 24 hour I-123 uptake = 34.1% (normal 10-30%) IMPRESSION: Mildly elevated 24 hour radio iodine uptakes with homogeneous tracer distribution in both thyroid lobes. Findings consistent with Graves disease. Electronically Signed   By: Lavonia Dana M.D.   On: 09/18/2022 09:28   US THYROID  Result Date: 07/09/2022 CLINICAL DATA:  Hyperthyroid. EXAM: THYROID ULTRASOUND TECHNIQUE: Ultrasound examination of the thyroid gland and adjacent soft tissues was performed. COMPARISON:  None  Available. FINDINGS: Parenchymal Echotexture: Moderately heterogenous Isthmus: 0.3 cm Right lobe: 4.9 x 1.6 x 1.7 cm Left lobe: 3.9 x 1.1 x 1.4 cm _________________________________________________________ Estimated total number of nodules >/= 1 cm: 0 Number of spongiform nodules >/=  2 cm not described below (TR1): 0 Number of mixed cystic and solid nodules >/= 1.5 cm not described below (TR2): 0 _________________________________________________________ No discrete nodules of consequence are seen within the thyroid gland. Normal parenchymal vascularity. Questionable small solid isoechoic nodule versus pseudo nodule in the right mid gland measures less than 1 cm in size and warrants no further evaluation. IMPRESSION: Enlarged and heterogeneous thyroid gland. No significant hypervascularity or thyroid nodules that would warrant further evaluation. The above is in keeping with the ACR TI-RADS recommendations - [employer] Mazzafer[employer] Am Coll Radiol 2017;14:587-595. Electronically Signed   By: Jacqulynn Cadet M.D.   On: 07/09/2022 15:51      ADDITIONAL PHYSICIAN COMMENTS/NOTES Graves disease : symptoms of intermittent palpitations for 5-10 years.  progressively worsening symptoms and troubling to her.  her most recent thyroid labs revealed suppressed TSH of < 0.005 and high Free T4 of 2.07 on 07/02/22.  Her TPO antibodies were also high at 287.    AUTHORIZED USER SIGNATURE & TIME STAMP: Rennis Golden, MD   10/15/22    1:10 PM

## 2022-10-19 ENCOUNTER — Ambulatory Visit (INDEPENDENT_AMBULATORY_CARE_PROVIDER_SITE_OTHER): Payer: BC Managed Care – PPO | Admitting: Obstetrics & Gynecology

## 2022-10-19 VITALS — BP 119/82 | HR 85 | Ht 66.0 in | Wt 126.0 lb

## 2022-10-19 DIAGNOSIS — Z01419 Encounter for gynecological examination (general) (routine) without abnormal findings: Secondary | ICD-10-CM | POA: Diagnosis not present

## 2022-10-19 DIAGNOSIS — Z124 Encounter for screening for malignant neoplasm of cervix: Secondary | ICD-10-CM

## 2022-10-19 NOTE — Progress Notes (Signed)
Subjective:    Tonya Estrada is a 46 y.o.  married P2 who presents for an annual exam. The patient has no complaints today. The patient is sexually active. GYN screening history: last pap: was normal. She denies a h/o abnormal paps. The patient wears seatbelts: yes. The patient participates in regular exercise: yes. Has the patient ever been transfused or tattooed?: no. The patient reports that there is not domestic violence in her life.   Menstrual History: OB History     Gravida  3   Para  2   Term  2   Preterm      AB  1   Living  2      SAB  1   IAB      Ectopic      Multiple      Live Births  2           Menarche age: 28 No LMP recorded. (Menstrual status: IUD).    The following portions of the patient's history were reviewed and updated as appropriate: allergies, current medications, past family history, past medical history, past social history, past surgical history, and problem list.  Review of Systems Pertinent items are noted in HPI.   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 She uses Mirena for contraception, has spotting about every other month  Objective:    BP 119/82   Pulse 85   Ht '5\' 6"'$  (1.676 m)   Wt 126 lb (57.2 kg)   BMI 20.34 kg/m   General Appearance:    Alert, cooperative, no distress, appears stated age  Head:    Normocephalic, without obvious abnormality, atraumatic  Eyes:    PERRL, conjunctiva/corneas clear, EOM's intact, fundi    benign, both eyes  Ears:    Normal TM's and external ear canals, both ears  Nose:   Nares normal, septum midline, mucosa normal, no drainage    or sinus tenderness  Throat:   Lips, mucosa, and tongue normal; teeth and gums normal  Neck:   Supple, symmetrical, trachea midline, no adenopathy;    thyroid:  no enlargement/tenderness/nodules; no carotid   bruit or JVD  Back:     Symmetric, no curvature, ROM normal, no CVA tenderness  Lungs:     Clear to auscultation bilaterally, respirations  unlabored  Chest Wall:    No tenderness or deformity   Heart:    Regular rate and rhythm, S1 and S2 normal, no murmur, rub   or gallop  Breast Exam:    No tenderness, masses, or nipple abnormality  Abdomen:     Soft, non-tender, bowel sounds active all four quadrants,    no masses, no organomegaly  Genitalia:    Normal female without lesion, discharge or tenderness, normal size and shape, retroverted uterus, normal adnexal exam      Extremities:   Extremities normal, atraumatic, no cyanosis or edema  Pulses:   2+ and symmetric all extremities  Skin:   Skin color, texture, turgor normal, no rashes or lesions  Lymph nodes:   Cervical, supraclavicular, and axillary nodes normal  Neurologic:   CNII-XII intact, normal strength, sensation and reflexes    throughout  .    Assessment:    Healthy female exam.    Plan:     Thin prep Pap smear.due in 2027

## 2022-11-18 ENCOUNTER — Other Ambulatory Visit: Payer: Self-pay | Admitting: Nurse Practitioner

## 2022-11-18 DIAGNOSIS — E0590 Thyrotoxicosis, unspecified without thyrotoxic crisis or storm: Secondary | ICD-10-CM

## 2022-11-20 ENCOUNTER — Encounter
Admission: RE | Admit: 2022-11-20 | Discharge: 2022-11-20 | Disposition: A | Discharge status: Home / Self Care | Payer: BC Managed Care – PPO | Source: Ambulatory Visit | Attending: Nurse Practitioner | Admitting: Nurse Practitioner

## 2022-11-20 ENCOUNTER — Other Ambulatory Visit
Admission: RE | Admit: 2022-11-20 | Discharge: 2022-11-20 | Disposition: A | Discharge status: Home / Self Care | Payer: BC Managed Care – PPO | Source: Home / Self Care | Attending: Nurse Practitioner | Admitting: Nurse Practitioner

## 2022-11-20 DIAGNOSIS — E0500 Thyrotoxicosis with diffuse goiter without thyrotoxic crisis or storm: Secondary | ICD-10-CM | POA: Diagnosis present

## 2022-11-20 DIAGNOSIS — Z3202 Encounter for pregnancy test, result negative: Secondary | ICD-10-CM | POA: Insufficient documentation

## 2022-11-20 DIAGNOSIS — E0590 Thyrotoxicosis, unspecified without thyrotoxic crisis or storm: Secondary | ICD-10-CM | POA: Insufficient documentation

## 2022-11-20 LAB — PREGNANCY, URINE: Preg Test, Ur: NEGATIVE

## 2022-11-20 MED ORDER — SODIUM IODIDE I 131 CAPSULE
21.0700 | Freq: Once | INTRAVENOUS | Status: AC | PRN
  Administered 2022-11-20: 21.07 via ORAL

## 2022-12-21 ENCOUNTER — Other Ambulatory Visit: Payer: Self-pay | Admitting: Nurse Practitioner

## 2022-12-22 ENCOUNTER — Encounter: Payer: Self-pay | Admitting: Nurse Practitioner

## 2022-12-22 ENCOUNTER — Ambulatory Visit: Payer: BC Managed Care – PPO | Admitting: Nurse Practitioner

## 2022-12-22 VITALS — BP 111/75 | HR 96 | Ht 66.0 in | Wt 125.0 lb

## 2022-12-22 DIAGNOSIS — E0590 Thyrotoxicosis, unspecified without thyrotoxic crisis or storm: Secondary | ICD-10-CM

## 2022-12-22 LAB — T3, FREE: T3, Free: 2.9 pg/mL (ref 2.0–4.4)

## 2022-12-22 LAB — TSH: TSH: <0.005 u[IU]/mL — ABNORMAL LOW (ref 0.450–4.500)

## 2022-12-22 LAB — T4, FREE: Free T4: 1.3 ng/dL (ref 0.82–1.77)

## 2022-12-22 NOTE — Progress Notes (Signed)
12/22/2022     Endocrinology Follow Up Note    Subjective:    Patient ID: Tonya Estrada, female    DOB: 1976/01/06, PCP Tonya Lick, NP.   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 - NEGATIVE FOR ATYPIA AND MALIGNANCY.   DILATION AND CURETTAGE OF UTERUS     INTRAUTERINE DEVICE (IUD) INSERTION  10/11/2014   KNEE ARTHROSCOPY     MELANOMA EXCISION  06/2014   both legs   WISDOM TOOTH EXTRACTION      Social History   Socioeconomic History   Marital status: Married    Spouse name: Not on file   Number of children: 2   Years of education: Not on file   Highest education level: Not on file  Occupational History   Occupation: Teacher  Tobacco Use   Smoking status: Never   Smokeless tobacco: Never  Vaping Use   Vaping Use: Never used  Substance and Sexual Activity   Alcohol use: Yes    Alcohol/week: 5.0 standard drinks of alcohol    Types: 5 Glasses of wine per week   Drug use: No   Sexual activity: Yes    Partners: Male    Birth control/protection: I.U.D.    Comment: Mirena  Other Topics Concern   Not on file  Social History Narrative   Not on file   Social Determinants of Health   Financial Resource Strain: Not on file  Food Insecurity: Not on file  Transportation Needs: Not on file  Physical Activity: Not on file  Stress: Not on file  Social Connections: Not on file    Family History  Problem Relation Age of Onset   Basal cell carcinoma Father 3   Rheum arthritis Mother    Multiple myeloma Maternal Grandmother    Heart disease Maternal Grandmother        MI   Hypertension Maternal Grandmother    Diabetes Maternal Grandfather        type 1   Heart disease Maternal Grandfather    Cancer Paternal Grandfather 66        brain   Breast cancer Other 45    Outpatient Encounter Medications as of 12/22/2022  Medication Sig   levonorgestrel (MIRENA) 20 MCG/24HR IUD 1 each by Intrauterine route once.   loratadine (CLARITIN) 10 MG tablet Take 10 mg by mouth as needed for allergies.   mometasone (ELOCON) 0.1 % cream Apply 1 application topically as needed.   Multiple Vitamin (MULTIVITAMIN) tablet Take 1 tablet by mouth daily.   No facility-administered encounter medications on file as of 12/22/2022.    ALLERGIES: Allergies  Allergen Reactions   Hydrocodone Hives    VACCINATION STATUS: Immunization History  Administered Date(s) Administered   DTaP 08/18/1977, 09/17/1977, 12/15/1977, 07/21/1979, 02/08/1986   Hepatitis B 06/07/1996, 07/13/1996, 12/14/1996   IPV 08/18/1977, 09/17/1977, 12/15/1977, 07/21/1979   Influenza,inj,Quad PF,6+ Mos 09/09/2015, 08/02/2019   Influenza,inj,quad, With Preservative 08/25/2017   Influenza-Unspecified 08/28/2016, 08/24/2017, 08/17/2018, 08/20/2020, 09/06/2021, 08/25/2022   MMR 11/18/1978, 07/19/1992   Moderna SARS-COV2 Booster Vaccination 09/18/2021   Moderna Sars-Covid-2 Vaccination 01/06/2020, 02/03/2020, 08/31/2020, 08/25/2022   Tdap 03/08/2009, 12/06/2019     HPI  Tonya Estrada is 47 y.o. female who presents today with a medical  history as above. she is being seen in follow up after being seen in consultation for hyperthyroidism requested by Tonya Guarneri T, NP.  she has been dealing with symptoms of intermittent palpitations for 5-10 years. These symptoms are progressively worsening and troubling to her.  her most recent thyroid labs revealed suppressed TSH of < 0.005 and high Free T4 of 2.07 on 07/02/22.  Her TPO antibodies were also high at 287. she denies dysphagia, choking, shortness of breath, no recent voice change.    she does have family history of thyroid dysfunction in her mom (hypothyroidism) and strong family history of RA and type 1 diabetes, but  denies family hx of thyroid cancer. she denies personal history of goiter. she is not on any anti-thyroid medications nor on any thyroid hormone supplements. Denies use of Biotin containing supplements.  she is willing to proceed with appropriate work up and therapy for thyrotoxicosis.  She had RAI ablation for hyperthyroidism on 11/20/22.  Review of systems  Constitutional: + Minimally fluctuating body weight,  current Body mass index is 20.18 kg/m. , no fatigue, no subjective hyperthermia, no subjective hypothermia Eyes: no blurry vision, no xerophthalmia ENT: no sore throat, no nodules palpated in throat, no dysphagia/odynophagia, no hoarseness Cardiovascular: no chest pain, no shortness of breath, no palpitations- completely resolved since RAI ablation, no leg swelling Respiratory: no cough, no shortness of breath Gastrointestinal: no nausea/vomiting/diarrhea Musculoskeletal: no muscle/joint aches Skin: no rashes, no hyperemia Neurological: no tremors, no numbness, no tingling, no dizziness Psychiatric: no depression, no anxiety   Objective:    BP 111/75 (BP Location: Right Arm, Patient Position: Sitting, Cuff Size: Normal)   Pulse 96   Ht 5' 6"$  (1.676 m)   Wt 125 lb (56.7 kg)   BMI 20.18 kg/m   Wt Readings from Last 3 Encounters:  12/22/22 125 lb (56.7 kg)  10/19/22 126 lb (57.2 kg)  10/12/22 128 lb 3.2 oz (58.2 kg)     BP Readings from Last 3 Encounters:  12/22/22 111/75  10/19/22 119/82  10/12/22 104/71                          Physical Exam- Limited  Constitutional:  Body mass index is 20.18 kg/m. , not in acute distress, normal state of mind Eyes:  EOMI, no exophthalmos Musculoskeletal: no gross deformities, strength intact in all four extremities, no gross restriction of joint movements Skin:  no rashes, no hyperemia Neurological: no tremor with outstretched hands   CMP     Component Value Date/Time   NA 140 06/24/2022 1523   K 4.3 06/24/2022 1523   CL  103 06/24/2022 1523   CO2 24 06/24/2022 1523   GLUCOSE 90 06/24/2022 1523   BUN 16 06/24/2022 1523   CREATININE 0.60 06/24/2022 1523   CALCIUM 9.6 06/24/2022 1523   PROT 7.1 06/24/2022 1523   ALBUMIN 4.5 06/24/2022 1523   AST 21 06/24/2022 1523   ALT 27 06/24/2022 1523   ALKPHOS 66 06/24/2022 1523   BILITOT 0.3 06/24/2022 1523   GFRNONAA 108 10/18/2018 0857   GFRAA 125 10/18/2018 0857     CBC    Component Value Date/Time   WBC 6.1 06/24/2022 1523   RBC 4.52 06/24/2022 1523   HGB 13.2 06/24/2022 1523   HCT 39.3 06/24/2022 1523   PLT 210 06/24/2022 1523   MCV 87 06/24/2022 1523   MCH 29.2 06/24/2022 1523   MCHC 33.6 06/24/2022 1523   RDW  12.0 06/24/2022 1523   LYMPHSABS 1.3 06/24/2022 1523   EOSABS 0.2 06/24/2022 1523   BASOSABS 0.0 06/24/2022 1523     Diabetic Labs (most recent): No results found for: "HGBA1C", "MICROALBUR"  Lipid Panel     Component Value Date/Time   CHOL 133 06/24/2022 1523   TRIG 104 06/24/2022 1523   HDL 47 06/24/2022 1523   LDLCALC 67 06/24/2022 1523   LABVLDL 19 06/24/2022 1523     Lab Results  Component Value Date   TSH <0.005 (L) 12/21/2022   TSH <0.005 (L) 09/07/2022   TSH <0.005 (L) 07/02/2022   TSH <0.005 (L) 06/24/2022   TSH 1.040 10/18/2018   TSH 1.290 09/09/2015   FREET4 1.30 12/21/2022   FREET4 1.88 (H) 09/07/2022   FREET4 2.07 (H) 07/02/2022      Uptake and Scan from 09/18/22 CLINICAL DATA:  Hyperthyroidism, TSH < 0.005   EXAM: THYROID SCAN AND UPTAKE - 4 AND 24 HOURS   TECHNIQUE: Following oral administration of I-123 capsule, anterior planar imaging was acquired at 24 hours. Thyroid uptake was calculated with a thyroid probe at 4-6 hours and 24 hours.   RADIOPHARMACEUTICALS:  317 uCi I-123 sodium iodide p.o.   COMPARISON:  Thyroid ultrasound 07/09/2022   FINDINGS: Homogeneous tracer distribution throughout both thyroid lobes, slightly larger RIGHT lobe than LEFT. No focal areas of increased or decreased  tracer localization seen.   4 hour I-123 uptake = 19.9% (normal 5-20%)   24 hour I-123 uptake = 34.1% (normal 10-30%)   IMPRESSION: Mildly elevated 24 hour radio iodine uptakes with homogeneous tracer distribution in both thyroid lobes.   Findings consistent with Graves disease.     Electronically Signed   By: Lavonia Dana M.D.   On: 09/18/2022 09:28   Latest Reference Range & Units 06/24/22 15:23 07/02/22 08:25 09/07/22 10:17 12/21/22 08:42  TSH 0.450 - 4.500 uIU/mL <0.005 (L) <0.005 (L) <0.005 (L) <0.005 (L)  Triiodothyronine,Free,Serum 2.0 - 4.4 pg/mL   5.1 (H) 2.9  T4,Free(Direct) 0.82 - 1.77 ng/dL  2.07 (H) 1.88 (H) 1.30  Thyroperoxidase Ab SerPl-aCnc 0 - 34 IU/mL  287 (H)    (L): Data is abnormally low (H): Data is abnormally high  Assessment & Plan:   1. Hyperthyroidism- r/Estrada Graves disease  she is being seen at a kind request of Cannady, Jolene T, NP.  her history and most recent labs are reviewed, and she was examined clinically. Subjective and objective findings are consistent with thyrotoxicosis likely from primary hyperthyroidism. The potential risks of untreated thyrotoxicosis and the need for definitive therapy have been discussed in detail with her, and she agrees to proceed with diagnostic workup and treatment plan.   Her antibody testing was positive indicating she does have genetic predisposition for thyroid dysfunction due to autoimmune disease.  Her uptake and scan shows mild elevation in 24 hour uptake, consistent with Graves disease.  We discussed RAI ablation vs Methimazole and decided to proceed with RAI ablation.  -She had RAI ablation on 11/20/22.   Her thyroid labs are suggestive of successful ablation with normal FT3 and FT4 values.  Her TSH is not yet responded but is typically the last to correct.  She is not quite ready to start thyroid hormone replacement therapy.  Will recheck labs in 6 weeks to help determine appropriate timing.  We did go over  s/s of hypothyroidism to be on the lookout for.  -Patient is advised to maintain close follow up with Tonya Lick, NP  for primary care needs.    I spent  15  minutes in the care of the patient today including review of labs from Thyroid Function, CMP, and other relevant labs ; imaging/biopsy records (current and previous including abstractions from other facilities); face-to-face time discussing  her lab results and symptoms, medications doses, her options of short and long term treatment based on the latest standards of care / guidelines;   and documenting the encounter.  Everlean Alstrom  participated in the discussions, expressed understanding, and voiced agreement with the above plans.  All questions were answered to her satisfaction. she is encouraged to contact clinic should she have any questions or concerns prior to her return visit.  Follow up plan: Return in about 6 weeks (around 02/02/2023) for Thyroid follow up, Previsit labs.   Thank you for involving me in the care of this pleasant patient, and I will continue to update you with her progress.   Rayetta Pigg, [facility] [address] [place], [place] [address] Phone: [phone] Fax: [phone]  12/22/2022, 8:45 AM

## 2023-01-27 ENCOUNTER — Telehealth: Payer: Self-pay | Admitting: Nurse Practitioner

## 2023-01-27 NOTE — Telephone Encounter (Signed)
Patient said she has an appt on 3/27 but has puffy face, dry skin and more fatigue. She is doing to do labs today. Please advise if she needs to be moved up per patient

## 2023-02-02 LAB — T3, FREE: T3, Free: <0.3 pg/mL — ABNORMAL LOW (ref 2.0–4.4)

## 2023-02-02 LAB — TSH: TSH: 115 u[IU]/mL — ABNORMAL HIGH (ref 0.450–4.500)

## 2023-02-02 LAB — T4, FREE: Free T4: 0.1 ng/dL — ABNORMAL LOW (ref 0.82–1.77)

## 2023-02-02 NOTE — Patient Instructions (Signed)

## 2023-02-03 ENCOUNTER — Encounter: Payer: Self-pay | Admitting: Nurse Practitioner

## 2023-02-03 ENCOUNTER — Ambulatory Visit: Payer: BC Managed Care – PPO | Admitting: Nurse Practitioner

## 2023-02-03 VITALS — BP 130/85 | HR 67 | Ht 66.0 in | Wt 131.2 lb

## 2023-02-03 DIAGNOSIS — E890 Postprocedural hypothyroidism: Secondary | ICD-10-CM

## 2023-02-03 MED ORDER — LEVOTHYROXINE SODIUM 50 MCG PO TABS: 50.0000 ug | ORAL_TABLET | Freq: Every day | ORAL | 3 refills | Status: DC

## 2023-02-03 NOTE — Progress Notes (Signed)
02/03/2023     Endocrinology Follow Up Note    Subjective:    Patient ID: Tonya Estrada, female    DOB: [birth year], PCP Venita Lick, NP.   Past Medical History:  Diagnosis Date   Allergy    Cancer ([place])    melanoma    Past Surgical History:  Procedure Laterality Date   breast biopsy Left 09/07/2022   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 - NEGATIVE FOR ATYPIA AND MALIGNANCY.   DILATION AND CURETTAGE OF UTERUS     INTRAUTERINE DEVICE (IUD) INSERTION  10/11/2014   KNEE ARTHROSCOPY     MELANOMA EXCISION  06/2014   both legs   WISDOM TOOTH EXTRACTION      Social History   Socioeconomic History   Marital status: Married    Spouse name: Not on file   Number of children: 2   Years of education: Not on file   Highest education level: Not on file  Occupational History   Occupation: Teacher  Tobacco Use   Smoking status: Never   Smokeless tobacco: Never  Vaping Use   Vaping Use: Never used  Substance and Sexual Activity   Alcohol use: Yes    Alcohol/week: 5.0 standard drinks of alcohol    Types: 5 Glasses of wine per week   Drug use: No   Sexual activity: Yes    Partners: Male    Birth control/protection: I.U.D.    Comment: Mirena  Other Topics Concern   Not on file  Social History Narrative   Not on file   Social Determinants of Health   Financial Resource Strain: Not on file  Food Insecurity: Not on file  Transportation Needs: Not on file  Physical Activity: Not on file  Stress: Not on file  Social Connections: Not on file    Family History  Problem Relation Age of Onset   Basal cell carcinoma Father 55   Rheum arthritis Mother    Multiple myeloma Maternal Grandmother    Heart disease Maternal Grandmother        MI   Hypertension Maternal Grandmother    Diabetes Maternal Grandfather        type 1   Heart disease Maternal Grandfather    Cancer Paternal Grandfather 23        brain   Breast cancer Other 11    Outpatient Encounter Medications as of 02/03/2023  Medication Sig   levonorgestrel (MIRENA) 20 MCG/24HR IUD 1 each by Intrauterine route once.   levothyroxine (SYNTHROID) 50 MCG tablet Take 1 tablet (50 mcg total) by mouth daily.   loratadine (CLARITIN) 10 MG tablet Take 10 mg by mouth as needed for allergies.   mometasone (ELOCON) 0.1 % cream Apply 1 application topically as needed.   Multiple Vitamin (MULTIVITAMIN) tablet Take 1 tablet by mouth daily.   No facility-administered encounter medications on file as of 02/03/2023.    ALLERGIES: Allergies  Allergen Reactions   Hydrocodone Hives    VACCINATION STATUS: Immunization History  Administered Date(s) Administered   DTaP 08/18/1977, 09/17/1977, 12/15/1977, 07/21/1979, 02/08/1986   Hepatitis B 06/07/1996, 07/13/1996, 12/14/1996   IPV 08/18/1977, 09/17/1977, 12/15/1977, 07/21/1979   Influenza,inj,Quad PF,6+ Mos 09/09/2015, 08/02/2019   Influenza,inj,quad, With Preservative 08/25/2017   Influenza-Unspecified 08/28/2016, 08/24/2017, 08/17/2018, 08/20/2020, 09/06/2021, 08/25/2022   MMR 11/18/1978, 07/19/1992   Moderna SARS-COV2 Booster Vaccination 09/18/2021   Moderna Sars-Covid-2 Vaccination 01/06/2020, 02/03/2020, 08/31/2020, 08/25/2022   Tdap 03/08/2009, 12/06/2019  HPI  Tonya Estrada is 47 y.o. female who presents today with a medical history as above. she is being seen in follow up after being seen in consultation for hyperthyroidism requested by Marnee Guarneri T, NP.  she has been dealing with symptoms of intermittent palpitations for 5-10 years. These symptoms are progressively worsening and troubling to her.   she does have family history of thyroid dysfunction in her mom (hypothyroidism) and strong family history of RA and type 1 diabetes, but denies family hx of thyroid cancer. she denies personal history of goiter. she is not on any anti-thyroid medications nor on any thyroid  hormone supplements. Denies use of Biotin containing supplements.  she is willing to proceed with appropriate work up and therapy for thyrotoxicosis.  She had RAI ablation for hyperthyroidism on 11/20/22.  Review of systems  Constitutional: + increasing body weight,  current Body mass index is 21.18 kg/m. , + fatigue, no subjective hyperthermia, + subjective hypothermia Eyes: no blurry vision, no xerophthalmia ENT: no sore throat, no nodules palpated in throat, no dysphagia/odynophagia, no hoarseness Cardiovascular: no chest pain, no shortness of breath, no palpitations, no leg swelling Respiratory: no cough, no shortness of breath Gastrointestinal: no nausea/vomiting/diarrhea Musculoskeletal: + jaw pain Skin: no rashes, no hyperemia, + dry skin Neurological: no tremors, no numbness, no tingling, no dizziness Psychiatric: no depression, no anxiety   Objective:    BP 130/85 (BP Location: Left Arm, Patient Position: Sitting, Cuff Size: Normal)   Pulse 67   Ht 5\' 6"  (1.676 m)   Wt 131 lb 3.2 oz (59.5 kg)   BMI 21.18 kg/m   Wt Readings from Last 3 Encounters:  02/03/23 131 lb 3.2 oz (59.5 kg)  12/22/22 125 lb (56.7 kg)  10/19/22 126 lb (57.2 kg)     BP Readings from Last 3 Encounters:  02/03/23 130/85  12/22/22 111/75  10/19/22 119/82                       Physical Exam- Limited  Constitutional:  Body mass index is 21.18 kg/m. , not in acute distress, normal state of mind Eyes:  EOMI, no exophthalmos Musculoskeletal: no gross deformities, strength intact in all four extremities, no gross restriction of joint movements Skin:  no rashes, no hyperemia Neurological: no tremor with outstretched hands   CMP     Component Value Date/Time   NA 140 06/24/2022 1523   K 4.3 06/24/2022 1523   CL 103 06/24/2022 1523   CO2 24 06/24/2022 1523   GLUCOSE 90 06/24/2022 1523   BUN 16 06/24/2022 1523   CREATININE 0.60 06/24/2022 1523   CALCIUM 9.6 06/24/2022 1523   PROT 7.1  06/24/2022 1523   ALBUMIN 4.5 06/24/2022 1523   AST 21 06/24/2022 1523   ALT 27 06/24/2022 1523   ALKPHOS 66 06/24/2022 1523   BILITOT 0.3 06/24/2022 1523   GFRNONAA 108 10/18/2018 0857   GFRAA 125 10/18/2018 0857     CBC    Component Value Date/Time   WBC 6.1 06/24/2022 1523   RBC 4.52 06/24/2022 1523   HGB 13.2 06/24/2022 1523   HCT 39.3 06/24/2022 1523   PLT 210 06/24/2022 1523   MCV 87 06/24/2022 1523   MCH 29.2 06/24/2022 1523   MCHC 33.6 06/24/2022 1523   RDW 12.0 06/24/2022 1523   LYMPHSABS 1.3 06/24/2022 1523   EOSABS 0.2 06/24/2022 1523   BASOSABS 0.0 06/24/2022 1523     Diabetic Labs (most recent): No  results found for: "HGBA1C", "MICROALBUR"  Lipid Panel     Component Value Date/Time   CHOL 133 06/24/2022 1523   TRIG 104 06/24/2022 1523   HDL 47 06/24/2022 1523   LDLCALC 67 06/24/2022 1523   LABVLDL 19 06/24/2022 1523     Lab Results  Component Value Date   TSH 115.000 (H) 01/28/2023   TSH <0.005 (L) 12/21/2022   TSH <0.005 (L) 09/07/2022   TSH <0.005 (L) 07/02/2022   TSH <0.005 (L) 06/24/2022   TSH 1.040 10/18/2018   TSH 1.290 09/09/2015   FREET4 0.10 (L) 01/28/2023   FREET4 1.30 12/21/2022   FREET4 1.88 (H) 09/07/2022   FREET4 2.07 (H) 07/02/2022      Uptake and Scan from 09/18/22 CLINICAL DATA:  Hyperthyroidism, TSH < 0.005   EXAM: THYROID SCAN AND UPTAKE - 4 AND 24 HOURS   TECHNIQUE: Following oral administration of I-123 capsule, anterior planar imaging was acquired at 24 hours. Thyroid uptake was calculated with a thyroid probe at 4-6 hours and 24 hours.   RADIOPHARMACEUTICALS:  317 uCi I-123 sodium iodide p.o.   COMPARISON:  Thyroid ultrasound 07/09/2022   FINDINGS: Homogeneous tracer distribution throughout both thyroid lobes, slightly larger RIGHT lobe than LEFT. No focal areas of increased or decreased tracer localization seen.   4 hour I-123 uptake = 19.9% (normal 5-20%)   24 hour I-123 uptake = 34.1% (normal  10-30%)   IMPRESSION: Mildly elevated 24 hour radio iodine uptakes with homogeneous tracer distribution in both thyroid lobes.   Findings consistent with Graves disease.     Electronically Signed   By: Lavonia Dana M.D.   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  Her antibody testing was positive indicating she does have genetic predisposition for thyroid dysfunction due to autoimmune disease.  Her uptake and scan shows mild elevation in 24 hour uptake, consistent with Graves disease.  We discussed RAI ablation vs Methimazole and decided to proceed with RAI ablation.  -She had RAI ablation on 11/20/22.   Her previsit labs are consistent with hypothyroidism, now ready to start thyroid hormone replacement therapy.  I discussed and initiated Levothyroxine 50 mcg po daily before breakfast.   - The correct intake of thyroid hormone (Levothyroxine, Synthroid), is on empty stomach first thing in the morning, with water, separated by at least 30 minutes from breakfast and other medications,  and separated by more than 4 hours from calcium, iron, multivitamins, acid reflux medications (PPIs).  - This medication is a life-long medication and will be needed to correct thyroid hormone imbalances for the rest of your life.  The dose may change from time to time, based on thyroid blood work.  - It is extremely important to be  consistent taking this medication, near the same time each morning.  -AVOID TAKING PRODUCTS CONTAINING BIOTIN (commonly found in Hair, Skin, Nails  vitamins) AS IT INTERFERES WITH THE VALIDITY OF THYROID FUNCTION BLOOD TESTS.   -Patient is advised to maintain close follow up with Venita Lick, NP for primary care needs.    I spent  15  minutes in the care of the patient today including review of labs from Thyroid Function, CMP, and other relevant labs ; imaging/biopsy records (current and previous including abstractions from other facilities); face-to-face time discussing  her lab results and symptoms, medications doses, her options of short and long term treatment based on the latest standards of care / guidelines;   and documenting the encounter.  Everlean Alstrom  participated in the discussions, expressed understanding, and voiced agreement with the above plans.  All questions were answered to her satisfaction. she is encouraged to contact clinic should she have any questions or concerns prior to her return visit.  Follow up plan: Return in about 2 months (around 04/05/2023) for Thyroid follow up, Previsit labs.   Thank you for involving me in the care of this pleasant patient, and I will continue to update you with her progress.   Rayetta Pigg, [facility] [address] [place], [place] [address] Phone: [phone] Fax: [phone]  02/03/2023, 4:08 PM

## 2023-02-26 ENCOUNTER — Other Ambulatory Visit: Payer: Self-pay | Admitting: Nurse Practitioner

## 2023-02-26 DIAGNOSIS — N6489 Other specified disorders of breast: Secondary | ICD-10-CM

## 2023-03-02 ENCOUNTER — Telehealth: Payer: Self-pay | Admitting: Nurse Practitioner

## 2023-03-02 NOTE — Telephone Encounter (Signed)
Returned patient's call and she stated that she already had her mammo scheduled.

## 2023-03-02 NOTE — Telephone Encounter (Signed)
Pt called in checking status of referral for mammo. I let her know its still being revfewed.

## 2023-03-07 NOTE — Patient Instructions (Signed)
Be Involved in Your Health Care:  Taking Medications When medications are taken as directed, they can greatly improve your health. But if they are not taken as instructed, they may not work. In some cases, not taking them correctly can be harmful. To help ensure your treatment remains effective and safe, understand your medications and how to take them.  Your lab results, notes and after visit summary will be available on My Chart. We strongly encourage you to use this feature. If lab results are abnormal the clinic will contact you with the appropriate steps. If the clinic does not contact you assume the results are satisfactory. You can always see your results on My Chart. If you have questions regarding your condition, please contact the clinic during office hours. You can also ask questions on My Chart.  We at [facility] are grateful that you chose [place] to provide care. We strive to provide excellent and compassionate care and are always looking for feedback. If you get a survey from the clinic please complete this.    Hyperthyroidism Hyperthyroidism refers to a thyroid gland that is too active or overactive. The thyroid gland is a small gland located in the lower front part of the neck, just in front of the windpipe (trachea). This gland makes hormones that: Help control how the body uses food for energy (metabolism). Help the heart and brain work well. Keep your bones strong. When the thyroid is overactive, it produces too much of a hormone called thyroxine. What are the causes? This condition may be caused by: Graves' disease. This is a disorder in which the body's disease-fighting system (immune system) attacks the thyroid gland. This is the most common cause. Inflammation of the thyroid gland. A tumor in the thyroid gland. Use of certain medicines, including: Prescription thyroid hormone replacement. Herbal supplements that mimic thyroid hormones. Amiodarone  therapy. Solid or fluid-filled lumps within your thyroid gland (thyroid nodules). Taking in a large amount of iodine from foods or medicines. What increases the risk? You are more likely to develop this condition if: You are female. You have a family history of thyroid conditions. You smoke tobacco. You use a medicine called lithium. You take medicines that affect the immune system (immunosuppressants). What are the signs or symptoms? Symptoms of this condition include: Nervousness. Inability to tolerate heat. Diarrhea. Rapid heart rate. Shaky hands. Restlessness. Sleep problems. Other symptoms may include: Heart skipping beats or making extra beats. Unexplained weight loss. Change in the texture of hair or skin. Loss of menstruation. Fatigue. Enlarged thyroid gland or a lump in the thyroid (nodule). You may also have symptoms of Graves' disease, which may include: Protruding eyes. Dry eyes. Red or swollen eyes. Problems with vision. How is this diagnosed? This condition may be diagnosed based on: Your symptoms and medical history. A physical exam. Blood tests. Thyroid ultrasound. This test involves using sound waves to produce images of the thyroid gland. A thyroid scan. A radioactive substance is injected into a vein, and images show how much iodine is present in the thyroid. Radioactive iodine uptake test (RAIU). A small amount of radioactive iodine is given by mouth to see how much iodine the thyroid absorbs after a certain amount of time. How is this treated? Treatment depends on the cause and severity of the condition. Treatment may include: Medicines to reduce the amount of thyroid hormone your body makes. Medicines to help manage your symptoms. Radioactive iodine treatment (radioiodine therapy). This involves swallowing a small dose  of radioactive iodine, in capsule or liquid form, to kill thyroid cells. Surgery to remove part or all of your thyroid gland. You may  need to take thyroid hormone replacement medicine for the rest of your life after thyroid surgery. Follow these instructions at home:  Take over-the-counter and prescription medicines only as told by your health care provider. Do not use any products that contain nicotine or tobacco. These products include cigarettes, chewing tobacco, and vaping devices, such as e-cigarettes. If you need help quitting, ask your health care provider. Follow any instructions from your health care provider about diet. You may be instructed to limit foods that contain iodine. Keep all follow-up visits. You will need to have blood tests regularly so that your health care provider can monitor your condition. Where to find more information [employer] of Diabetes and Digestive and Kidney Diseases: [email or web address] Contact a health care provider if: Your symptoms do not get better with treatment. You have a fever. You have abdominal pain. You feel dizzy. You are taking thyroid hormone replacement medicine and: You have symptoms of depression. You feel like you are tired all the time. You gain weight. Get help right away if: You have sudden, unexplained confusion or other mental changes. You have chest pain. You have fast or irregular heartbeats (palpitations). You have difficulty breathing. These symptoms may be an emergency. Get help right away. Call 911. Do not wait to see if the symptoms will go away. Do not drive yourself to the hospital. Summary The thyroid gland is a small gland located in the lower front part of the neck, just in front of the windpipe. Hyperthyroidism is when the thyroid gland is too active and produces too much of a hormone called thyroxine. The most common cause is Graves' disease, a disorder in which your immune system attacks the thyroid gland. Hyperthyroidism can cause various symptoms, such as unexplained weight loss, nervousness, inability to tolerate heat, or changes in your  heartbeat. Treatment may include medicine to reduce the amount of thyroid hormone your body makes, radioiodine therapy, surgery, or medicines to manage symptoms. This information is not intended to replace advice given to you by your health care provider. Make sure you discuss any questions you have with your health care provider. Document Revised: 12/19/2021 Document Reviewed: 12/19/2021 Elsevier Patient Education  2023 [employer].

## 2023-03-09 ENCOUNTER — Ambulatory Visit: Payer: BC Managed Care – PPO | Admitting: Nurse Practitioner

## 2023-03-09 ENCOUNTER — Encounter: Payer: Self-pay | Admitting: Nurse Practitioner

## 2023-03-09 VITALS — BP 119/81 | HR 66 | Temp 98.4°F | Ht 65.98 in | Wt 129.0 lb

## 2023-03-09 DIAGNOSIS — M26622 Arthralgia of left temporomandibular joint: Secondary | ICD-10-CM

## 2023-03-09 DIAGNOSIS — E0590 Thyrotoxicosis, unspecified without thyrotoxic crisis or storm: Secondary | ICD-10-CM

## 2023-03-09 DIAGNOSIS — E890 Postprocedural hypothyroidism: Secondary | ICD-10-CM

## 2023-03-09 NOTE — Assessment & Plan Note (Signed)
Currently to left side.  Recommend continue simple treatments at home.  Referral to PT placed to work on exercised to help with this.

## 2023-03-09 NOTE — Progress Notes (Signed)
BP 119/81   Pulse 66   Temp 98.4 F (36.9 C) (Oral)   Ht 5' 5.98" (1.676 m)   Wt 129 lb (58.5 kg)   SpO2 99%   BMI 20.83 kg/m    Subjective:    Patient ID: Tonya Estrada, female    DOB: 03-Mar-1976, 47 y.o.   MRN: [id number]  HPI: Tonya Estrada is a 48 y.o. female  Chief Complaint  Patient presents with   Hyperthroid   HYPERTHYROIDISM Noted initially on labs 06/24/22 with repeat remaining hyper and antibody elevated.  Follows with endocrinology with last visit on 02/03/23.  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  She has been having some TMJ symptoms since thyroid changes with ablation. Is popping and annoying to left side. Has had similar in past, but did not last as long. Thyroid control status: stable Etiology of hypothyroidism: autoimmune Fatigue: no Cold intolerance: no Heat intolerance: no Weight gain: no Weight loss: no Constipation: no Diarrhea/loose stools: no Palpitations: no -- these have reduced Lower extremity edema: no Anxiety/depressed mood: no   Relevant past medical, surgical, family and social history reviewed and updated as indicated. Interim medical history since our last visit reviewed. Allergies and medications reviewed and updated.  Review of Systems  Constitutional:  Negative for activity change, appetite change, diaphoresis, fatigue and fever.  Respiratory:  Negative for cough, chest tightness and shortness of breath.   Cardiovascular:  Negative for chest pain, palpitations and leg swelling.  Gastrointestinal: Negative.   Endocrine: Negative for cold intolerance and heat intolerance.  Neurological: Negative.   Psychiatric/Behavioral: Negative.      Per HPI unless specifically indicated above     Objective:    BP 119/81   Pulse 66   Temp 98.4 F (36.9 C) (Oral)   Ht 5' 5.98" (1.676 m)   Wt 129 lb (58.5 kg)   SpO2 99%   BMI 20.83 kg/m   Wt Readings from  Last 3 Encounters:  03/09/23 129 lb (58.5 kg)  02/03/23 131 lb 3.2 oz (59.5 kg)  12/22/22 125 lb (56.7 kg)    Physical Exam Vitals and nursing note reviewed.  Constitutional:      General: She is awake. She is not in acute distress.    Appearance: She is well-developed and well-groomed. She is not ill-appearing or toxic-appearing.  HENT:     Head: Normocephalic.     Right Ear: Hearing normal.     Left Ear: Hearing normal.  Eyes:     General: Lids are normal.        Right eye: No discharge.        Left eye: No discharge.     Conjunctiva/sclera: Conjunctivae normal.     Pupils: Pupils are equal, round, and reactive to light.  Neck:     Thyroid: No thyromegaly.     Vascular: No carotid bruit.  Cardiovascular:     Rate and Rhythm: Normal rate and regular rhythm.     Heart sounds: Normal heart sounds. No murmur heard.    No gallop.  Pulmonary:     Effort: Pulmonary effort is normal. No accessory muscle usage or respiratory distress.     Breath sounds: Normal breath sounds.  Abdominal:     General: Bowel sounds are normal.     Palpations: Abdomen is soft. There is no hepatomegaly or splenomegaly.  Musculoskeletal:     Cervical back: Normal range of motion  and neck supple.     Right lower leg: No edema.     Left lower leg: No edema.  Lymphadenopathy:     Cervical: No cervical adenopathy.  Skin:    General: Skin is warm and dry.  Neurological:     Mental Status: She is alert and oriented to person, place, and time.  Psychiatric:        Attention and Perception: Attention normal.        Mood and Affect: Mood normal.        Speech: Speech normal.        Behavior: Behavior normal. Behavior is cooperative.        Thought Content: Thought content normal.    Results for orders placed or performed in visit on 12/22/22  TSH  Result Value Ref Range   TSH 115.000 (H) 0.450 - 4.500 uIU/mL  T4, free  Result Value Ref Range   Free T4 0.10 (L) 0.82 - 1.77 ng/dL  T3, free  Result  Value Ref Range   T3, Free <0.3 (L) 2.0 - 4.4 pg/mL      Assessment & Plan:   Problem List Items Addressed This Visit       Endocrine   Postablative hypothyroidism - Primary    Ongoing, ablation on 11/20/22 for hyperthyroid.  Currently taking Levothyroxine 50 MCG daily, will continue this regimen and adjust as needed.  Reached out to endo and if okay will take over treatment and monitoring hypothyroid in office to reduce commute time for patient.        Musculoskeletal and Integument   TMJ arthralgia    Currently to left side.  Recommend continue simple treatments at home.  Referral to PT placed to work on exercised to help with this.      Relevant Orders   Ambulatory referral to Physical Therapy     Follow up plan: Return in about 4 months (around 06/28/2023) for Annual physical.

## 2023-03-09 NOTE — Assessment & Plan Note (Signed)
Ongoing, ablation on 11/20/22 for hyperthyroid.  Currently taking Levothyroxine 50 MCG daily, will continue this regimen and adjust as needed.  Reached out to endo and if okay will take over treatment and monitoring hypothyroid in office to reduce commute time for patient.

## 2023-03-12 ENCOUNTER — Ambulatory Visit
Admission: RE | Admit: 2023-03-12 | Discharge: 2023-03-12 | Disposition: A | Discharge status: Home / Self Care | Payer: BC Managed Care – PPO | Source: Ambulatory Visit | Attending: Nurse Practitioner | Admitting: Nurse Practitioner

## 2023-03-12 DIAGNOSIS — N6489 Other specified disorders of breast: Secondary | ICD-10-CM | POA: Insufficient documentation

## 2023-03-12 NOTE — Progress Notes (Signed)
Contacted via MyChart   Normal mammogram, may repeat in one year:)

## 2023-04-13 LAB — TSH: TSH: 117 u[IU]/mL — ABNORMAL HIGH (ref 0.450–4.500)

## 2023-04-13 LAB — T4, FREE: Free T4: 0.78 ng/dL — ABNORMAL LOW (ref 0.82–1.77)

## 2023-04-16 NOTE — Patient Instructions (Signed)

## 2023-04-19 ENCOUNTER — Ambulatory Visit: Payer: BC Managed Care – PPO | Admitting: Nurse Practitioner

## 2023-04-19 ENCOUNTER — Encounter: Payer: Self-pay | Admitting: Nurse Practitioner

## 2023-04-19 VITALS — BP 116/79 | HR 78 | Ht 65.98 in | Wt 126.6 lb

## 2023-04-19 DIAGNOSIS — E890 Postprocedural hypothyroidism: Secondary | ICD-10-CM | POA: Diagnosis not present

## 2023-04-19 MED ORDER — LEVOTHYROXINE SODIUM 88 MCG PO TABS: 88.0000 ug | ORAL_TABLET | Freq: Every day | ORAL | 1 refills | Status: DC

## 2023-04-19 NOTE — Progress Notes (Signed)
04/19/2023     Endocrinology Follow Up Note    Subjective:    Patient ID: Tonya Estrada, female    DOB: 1975-11-23, PCP Marjie Skiff, NP.   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 - NEGATIVE FOR ATYPIA AND MALIGNANCY.   DILATION AND CURETTAGE OF UTERUS     INTRAUTERINE DEVICE (IUD) INSERTION  10/11/2014   KNEE ARTHROSCOPY     MELANOMA EXCISION  06/2014   both legs   WISDOM TOOTH EXTRACTION      Social History   Socioeconomic History   Marital status: Married    Spouse name: Not on file   Number of children: 2   Years of education: Not on file   Highest education level: Master's degree (e.g., MA, MS, MEng, MEd, MSW, MBA)  Occupational History   Occupation: Teacher  Tobacco Use   Smoking status: Never   Smokeless tobacco: Never  Vaping Use   Vaping Use: Never used  Substance and Sexual Activity   Alcohol use: Yes    Alcohol/week: 5.0 standard drinks of alcohol    Types: 5 Glasses of wine per week   Drug use: No   Sexual activity: Yes    Partners: Male    Birth control/protection: I.U.D.    Comment: Mirena  Other Topics Concern   Not on file  Social History Narrative   Not on file   Social Determinants of Health   Financial Resource Strain: Low Risk  (03/06/2023)   Overall Financial Resource Strain (CARDIA)    Difficulty of Paying Living Expenses: Not hard at all  Food Insecurity: No Food Insecurity (03/06/2023)   Hunger Vital Sign    Worried About Running Out of Food in the Last Year: Never true    Ran Out of Food in the Last Year: Never true  Transportation Needs: No Transportation Needs (03/06/2023)   PRAPARE - Administrator, Civil Service (Medical): No    Lack of Transportation (Non-Medical): No   Physical Activity: Insufficiently Active (03/06/2023)   Exercise Vital Sign    Days of Exercise per Week: 2 days    Minutes of Exercise per Session: 10 min  Stress: No Stress Concern Present (03/06/2023)   [employer] of Occupational Health - Occupational Stress Questionnaire    Feeling of Stress : Only a little  Social Connections: Moderately Integrated (03/06/2023)   Social Connection and Isolation Panel [NHANES]    Frequency of Communication with Friends and Family: Once a week    Frequency of Social Gatherings with Friends and Family: Once a week    Attends Religious Services: More than 4 times per year    Active Member of [employer] or Organizations: Yes    Attends Engineer, structural: More than 4 times per year    Marital Status: Married    Family History  Problem Relation Age of Onset   Rheum arthritis Mother    Basal cell carcinoma Father 31   Other Sister 65       Radial sclerosing lesions (RSLs)   Multiple myeloma Maternal Grandmother    Heart disease Maternal Grandmother        MI   Hypertension Maternal Grandmother    Diabetes Maternal Grandfather  type 1   Heart disease Maternal Grandfather    Cancer Paternal Grandfather 44       brain   Breast cancer Other 29    Outpatient Encounter Medications as of 04/19/2023  Medication Sig   levonorgestrel (MIRENA) 20 MCG/24HR IUD 1 each by Intrauterine route once.   loratadine (CLARITIN) 10 MG tablet Take 10 mg by mouth as needed for allergies.   mometasone (ELOCON) 0.1 % cream Apply 1 application topically as needed.   Multiple Vitamin (MULTIVITAMIN) tablet Take 1 tablet by mouth daily.   [DISCONTINUED] levothyroxine (SYNTHROID) 50 MCG tablet Take 1 tablet (50 mcg total) by mouth daily.   levothyroxine (SYNTHROID) 88 MCG tablet Take 1 tablet (88 mcg total) by mouth daily before breakfast.   No facility-administered encounter medications on file as of 04/19/2023.    ALLERGIES: Allergies  Allergen  Reactions   Hydrocodone Hives    VACCINATION STATUS: Immunization History  Administered Date(s) Administered   DTaP 08/18/1977, 09/17/1977, 12/15/1977, 07/21/1979, 02/08/1986   Hepatitis B 06/07/1996, 07/13/1996, 12/14/1996   IPV 08/18/1977, 09/17/1977, 12/15/1977, 07/21/1979   Influenza,inj,Quad PF,6+ Mos 09/09/2015, 08/02/2019   Influenza,inj,quad, With Preservative 08/25/2017   Influenza-Unspecified 08/28/2016, 08/24/2017, 08/17/2018, 08/20/2020, 09/06/2021, 08/25/2022   MMR 11/18/1978, 07/19/1992   Moderna SARS-COV2 Booster Vaccination 09/18/2021   Moderna Sars-Covid-2 Vaccination 01/06/2020, 02/03/2020, 08/31/2020, 08/25/2022   Tdap 03/08/2009, 12/06/2019     HPI  Tonya Estrada is 47 y.o. female who presents today with a medical history as above. she is being seen in follow up after being seen in consultation for hyperthyroidism requested by Aura Dials T, NP.  she has been dealing with symptoms of intermittent palpitations for 5-10 years. These symptoms are progressively worsening and troubling to her.   she does have family history of thyroid dysfunction in her mom (hypothyroidism) and strong family history of RA and type 1 diabetes, but denies family hx of thyroid cancer. she denies personal history of goiter. she is not on any anti-thyroid medications nor on any thyroid hormone supplements. Denies use of Biotin containing supplements.  she is willing to proceed with appropriate work up and therapy for thyrotoxicosis.  She had RAI ablation for hyperthyroidism on 11/20/22.  Review of systems  Constitutional: + stable body weight,  current Body mass index is 20.45 kg/m. , + fatigue, no subjective hyperthermia Eyes: no blurry vision, no xerophthalmia ENT: no sore throat, no nodules palpated in throat, no dysphagia/odynophagia, no hoarseness Cardiovascular: no chest pain, no shortness of breath, no palpitations, no leg swelling Respiratory: no cough, no shortness of  breath Gastrointestinal: no nausea/vomiting/diarrhea Musculoskeletal: + muscle cramping Skin: no rashes, no hyperemia, + dry skin, + hair loss Neurological: no tremors, no numbness, no tingling, no dizziness Psychiatric: no depression, no anxiety   Objective:    BP 116/79 (BP Location: Left Arm, Patient Position: Sitting, Cuff Size: Normal)   Pulse 78   Ht 5' 5.98" (1.676 m)   Wt 126 lb 9.6 oz (57.4 kg)   BMI 20.45 kg/m   Wt Readings from Last 3 Encounters:  04/19/23 126 lb 9.6 oz (57.4 kg)  03/09/23 129 lb (58.5 kg)  02/03/23 131 lb 3.2 oz (59.5 kg)     BP Readings from Last 3 Encounters:  04/19/23 116/79  03/09/23 119/81  02/03/23 130/85                       Physical Exam- Limited  Constitutional:  Body mass index is 20.45 kg/m. ,  not in acute distress, normal state of mind Eyes:  EOMI, no exophthalmos Musculoskeletal: no gross deformities, strength intact in all four extremities, no gross restriction of joint movements Skin:  no rashes, no hyperemia Neurological: no tremor with outstretched hands   CMP     Component Value Date/Time   NA 140 06/24/2022 1523   K 4.3 06/24/2022 1523   CL 103 06/24/2022 1523   CO2 24 06/24/2022 1523   GLUCOSE 90 06/24/2022 1523   BUN 16 06/24/2022 1523   CREATININE 0.60 06/24/2022 1523   CALCIUM 9.6 06/24/2022 1523   PROT 7.1 06/24/2022 1523   ALBUMIN 4.5 06/24/2022 1523   AST 21 06/24/2022 1523   ALT 27 06/24/2022 1523   ALKPHOS 66 06/24/2022 1523   BILITOT 0.3 06/24/2022 1523   GFRNONAA 108 10/18/2018 0857   GFRAA 125 10/18/2018 0857     CBC    Component Value Date/Time   WBC 6.1 06/24/2022 1523   RBC 4.52 06/24/2022 1523   HGB 13.2 06/24/2022 1523   HCT 39.3 06/24/2022 1523   PLT 210 06/24/2022 1523   MCV 87 06/24/2022 1523   MCH 29.2 06/24/2022 1523   MCHC 33.6 06/24/2022 1523   RDW 12.0 06/24/2022 1523   LYMPHSABS 1.3 06/24/2022 1523   EOSABS 0.2 06/24/2022 1523   BASOSABS 0.0 06/24/2022 1523      Diabetic Labs (most recent): No results found for: "HGBA1C", "MICROALBUR"  Lipid Panel     Component Value Date/Time   CHOL 133 06/24/2022 1523   TRIG 104 06/24/2022 1523   HDL 47 06/24/2022 1523   LDLCALC 67 06/24/2022 1523   LABVLDL 19 06/24/2022 1523     Lab Results  Component Value Date   TSH 117.000 (H) 04/12/2023   TSH 115.000 (H) 01/28/2023   TSH <0.005 (L) 12/21/2022   TSH <0.005 (L) 09/07/2022   TSH <0.005 (L) 07/02/2022   TSH <0.005 (L) 06/24/2022   TSH 1.040 10/18/2018   TSH 1.290 09/09/2015   FREET4 0.78 (L) 04/12/2023   FREET4 0.10 (L) 01/28/2023   FREET4 1.30 12/21/2022   FREET4 1.88 (H) 09/07/2022   FREET4 2.07 (H) 07/02/2022      Uptake and Scan from 09/18/22 CLINICAL DATA:  Hyperthyroidism, TSH < 0.005   EXAM: THYROID SCAN AND UPTAKE - 4 AND 24 HOURS   TECHNIQUE: Following oral administration of I-123 capsule, anterior planar imaging was acquired at 24 hours. Thyroid uptake was calculated with a thyroid probe at 4-6 hours and 24 hours.   RADIOPHARMACEUTICALS:  317 uCi I-123 sodium iodide p.o.   COMPARISON:  Thyroid ultrasound 07/09/2022   FINDINGS: Homogeneous tracer distribution throughout both thyroid lobes, slightly larger RIGHT lobe than LEFT. No focal areas of increased or decreased tracer localization seen.   4 hour I-123 uptake = 19.9% (normal 5-20%)   24 hour I-123 uptake = 34.1% (normal 10-30%)   IMPRESSION: Mildly elevated 24 hour radio iodine uptakes with homogeneous tracer distribution in both thyroid lobes.   Findings consistent with Graves disease.     Electronically Signed   By: Ulyses Southward M.D.   On: 09/18/2022 09:28   Latest Reference Range & Units 07/02/22 08:25 09/07/22 10:17 12/21/22 08:42 01/28/23 07:55 04/12/23 07:17  TSH 0.450 - 4.500 uIU/mL <0.005 (L) <0.005 (L) <0.005 (L) 115.000 (H) 117.000 (H)  Triiodothyronine,Free,Serum 2.0 - 4.4 pg/mL  5.1 (H) 2.9 <0.3 (L)   T4,Free(Direct) 0.82 - 1.77 ng/dL  1.61 (H) 0.96 (H) 0.45 0.10 (L) 0.78 (L)  Thyroperoxidase  Ab SerPl-aCnc 0 - 34 IU/mL 287 (H)      (L): Data is abnormally low (H): Data is abnormally high  Assessment & Plan:   1. 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  Her antibody testing was positive indicating she does have genetic predisposition for thyroid dysfunction due to autoimmune disease.  Her uptake and scan shows mild elevation in 24 hour uptake, consistent with Graves disease.  We discussed RAI ablation vs Methimazole and decided to proceed with RAI ablation.  -She had RAI ablation on 11/20/22.   Her previsit labs are consistent with under-replacement.  She is advised to increase her Levothyroxine to 88 mcg po daily before breakfast.  She will be transferring back to her PCP for management of this due to distance.  I do recommend rechecking thyroid labs in 3 months for further med adjustment.  I did enter them and will forward the results when I get them.   - The correct intake of thyroid hormone (Levothyroxine, Synthroid), is on empty stomach first thing in the morning, with water, separated by at least 30 minutes from breakfast and other medications,  and separated by more than 4 hours from calcium, iron, multivitamins, acid reflux medications (PPIs).  - This medication is a life-long medication and will be needed to correct thyroid hormone imbalances for the rest of your life.  The dose may change from time to time, based on thyroid blood work.  - It is extremely important to be consistent taking this medication, near the same time each morning.  -AVOID TAKING PRODUCTS CONTAINING BIOTIN (commonly found in Hair, Skin, Nails vitamins) AS IT INTERFERES WITH THE VALIDITY OF THYROID FUNCTION BLOOD TESTS.   -Patient is advised to maintain close follow up with Marjie Skiff, NP for primary care needs.     I spent  23  minutes in the  care of the patient today including review of labs from Thyroid Function, CMP, and other relevant labs ; imaging/biopsy records (current and previous including abstractions from other facilities); face-to-face time discussing  her lab results and symptoms, medications doses, her options of short and long term treatment based on the latest standards of care / guidelines;   and documenting the encounter.  Norma Fredrickson  participated in the discussions, expressed understanding, and voiced agreement with the above plans.  All questions were answered to her satisfaction. she is encouraged to contact clinic should she have any questions or concerns prior to her return visit.  Follow up plan: Return will be transferring back to PCP for management at this time.   Thank you for involving me in the care of this pleasant patient, and I will continue to update you with her progress.   Ronny Bacon, [facility] [address] [place], [place] [address] Phone: [phone] Fax: [phone]  04/19/2023, 4:24 PM

## 2023-05-17 ENCOUNTER — Encounter: Payer: Self-pay | Admitting: *Deleted

## 2023-05-17 ENCOUNTER — Ambulatory Visit: Payer: Self-pay

## 2023-05-17 NOTE — Telephone Encounter (Signed)
This encounter was created in error - please disregard.

## 2023-05-17 NOTE — Telephone Encounter (Signed)
Copied from CRM [id number]. Topic: General - Other >> May 17, 2023 12:00 PM Dominique A wrote: Reason for CRM: Pt is calling see if her PCP can send in an order for her to get a colonoscopy. Please advise.

## 2023-05-17 NOTE — Telephone Encounter (Signed)
Patient returned call , reports she was disconnected. Patient requesting referral for colonoscopy prior to restarting school and appt for hair loss. Appt scheduled with PCP 06/01/23. Please advise. See previous note regarding hair loss and thyroid medication.

## 2023-05-17 NOTE — Telephone Encounter (Signed)
Pt is requesting to speak with a nurse regarding her thyroid medication. Pt states that she cannot remember the name of her medication. Please advise.   Chief Complaint: Pt. Has noticed "a lot of hair loss x 1 month." Asking if this related to her thyroid medication or thyroid issues. What can she do for this?  Symptoms: Hair loss Frequency: 1 month ago Pertinent Negatives: Patient denies  Disposition: [] ED /[] Urgent Care (no appt availability in office) / [] Appointment(In office/virtual)/ []  [facility]/ [] Home Care/ [] Refused Recommended Disposition /[] [facility]/ [x]  Follow-up with PCP Additional Notes: Please advise pt.  Reason for Disposition  [1] Caller has URGENT medicine question about med that PCP or specialist prescribed AND [2] triager unable to answer question  Answer Assessment - Initial Assessment Questions 1. NAME of MEDICINE: "What medicine(s) are you calling about?"     Levothyroxine 2. QUESTION: "What is your question?" (e.g., double dose of medicine, side effect)     Having " a lot of hair loss." 3. PRESCRIBER: "Who prescribed the medicine?" Reason: if prescribed by specialist, call should be referred to that group.     Cannady 4. SYMPTOMS: "Do you have any symptoms?" If Yes, ask: "What symptoms are you having?"  "How bad are the symptoms (e.g., mild, moderate, severe)     Hair loss 5. PREGNANCY:  "Is there any chance that you are pregnant?" "When was your last menstrual period?"     No  Protocols used: Medication Question Call-A-AH

## 2023-05-30 ENCOUNTER — Encounter: Payer: Self-pay | Admitting: Nurse Practitioner

## 2023-05-30 DIAGNOSIS — L659 Nonscarring hair loss, unspecified: Secondary | ICD-10-CM | POA: Insufficient documentation

## 2023-05-30 NOTE — Patient Instructions (Incomplete)

## 2023-06-03 ENCOUNTER — Encounter: Payer: Self-pay | Admitting: Nurse Practitioner

## 2023-06-03 ENCOUNTER — Telehealth: Payer: Self-pay

## 2023-06-03 ENCOUNTER — Ambulatory Visit: Payer: BC Managed Care – PPO | Admitting: Nurse Practitioner

## 2023-06-03 VITALS — BP 96/63 | HR 73 | Temp 98.3°F | Wt 126.8 lb

## 2023-06-03 DIAGNOSIS — Z1211 Encounter for screening for malignant neoplasm of colon: Secondary | ICD-10-CM

## 2023-06-03 DIAGNOSIS — L659 Nonscarring hair loss, unspecified: Secondary | ICD-10-CM

## 2023-06-03 DIAGNOSIS — E890 Postprocedural hypothyroidism: Secondary | ICD-10-CM

## 2023-06-03 NOTE — Telephone Encounter (Signed)
Pt left vmm to schedule colonoscopy please return call 

## 2023-06-03 NOTE — Assessment & Plan Note (Signed)
Ongoing, ablation on 11/20/22 for hyperthyroid.  Currently taking Levothyroxine 88 MCG daily, will continue this regimen and adjust as needed.  PCP is taking over treatment at this time.  Recent TSH 117 -- suspect is still elevated.  Will check today and adjust as needed.  Discussed symptoms with hypothyroid, which can include hair loss/cold intolerance/depressed mood.

## 2023-06-03 NOTE — Progress Notes (Signed)
BP 96/63   Pulse 73   Temp 98.3 F (36.8 C) (Oral)   Wt 126 lb 12.8 oz (57.5 kg)   SpO2 99%   BMI 20.48 kg/m    Subjective:    Patient ID: Tonya Estrada, female    DOB: 08-23-1976, 47 y.o.   MRN: [id number]  HPI: Tonya Estrada is a 47 y.o. female  Chief Complaint  Patient presents with   Alopecia    Pt states she has been noticing hair loss for about 6 weeks    HYPOTHYROIDISM Is followed by endocrinology, but is going to see PCP from now on.  Had labs in June noting TSH 117 and dose change made by endo.  Taking Levothyroxine 88 MCG at this time.   Thyroid control status:uncontrolled Satisfied with current treatment? yes Medication side effects: no Medication compliance: good compliance Etiology of hypothyroidism: postablative Recent dose adjustment:yes -- 6 weeks ago Fatigue: no Cold intolerance: yes Heat intolerance: no Weight gain: no Weight loss: no Constipation: no Diarrhea/loose stools: no Palpitations: no Lower extremity edema: no Anxiety/depressed mood: yes -- feeling down more recently    06/03/2023   10:17 AM 03/09/2023    8:23 AM 09/07/2022    1:26 PM 06/24/2022    3:22 PM 06/21/2020    2:03 PM  Depression screen PHQ 2/9  Decreased Interest 0 0 0 0 0  Down, Depressed, Hopeless 1 0 0 0 0  PHQ - 2 Score 1 0 0 0 0  Altered sleeping 0 0 0    Tired, decreased energy 0 0     Change in appetite 0 0 0    Feeling bad or failure about yourself  0 0 0    Trouble concentrating 0 0 0    Moving slowly or fidgety/restless 0 0 0    Suicidal thoughts 0 0 0    PHQ-9 Score 1 0 0    Difficult doing work/chores Not difficult at all Not difficult at all Not difficult at all         06/03/2023   10:17 AM 03/09/2023    8:23 AM 09/07/2022    1:26 PM 10/18/2018    8:29 AM  GAD 7 : Generalized Anxiety Score  Nervous, Anxious, on Edge 0 0 0 0  Control/stop worrying 0 0 0 0  Worry too much - different things 0 0 0 0  Trouble relaxing 0 0 0 0  Restless 0 0 0 0   Easily annoyed or irritable 0 0 0 0  Afraid - awful might happen 0 0 0 0  Total GAD 7 Score 0 0 0 0  Anxiety Difficulty Not difficult at all Not difficult at all Not difficult at all    Relevant past medical, surgical, family and social history reviewed and updated as indicated. Interim medical history since our last visit reviewed. Allergies and medications reviewed and updated.  Review of Systems  Constitutional:  Negative for activity change, appetite change, diaphoresis, fatigue and fever.  Respiratory:  Negative for cough, chest tightness, shortness of breath and wheezing.   Cardiovascular:  Negative for chest pain, palpitations and leg swelling.  Endocrine: Positive for cold intolerance. Negative for heat intolerance.  Neurological: Negative.   Psychiatric/Behavioral:  Negative for dysphoric mood, self-injury, sleep disturbance and suicidal ideas. The patient is not nervous/anxious.     Per HPI unless specifically indicated above     Objective:    BP 96/63   Pulse 73   Temp 98.3  F (36.8 C) (Oral)   Wt 126 lb 12.8 oz (57.5 kg)   SpO2 99%   BMI 20.48 kg/m   Wt Readings from Last 3 Encounters:  06/03/23 126 lb 12.8 oz (57.5 kg)  04/19/23 126 lb 9.6 oz (57.4 kg)  03/09/23 129 lb (58.5 kg)    Physical Exam Vitals and nursing note reviewed.  Constitutional:      General: She is awake. She is not in acute distress.    Appearance: She is well-developed and well-groomed. She is not ill-appearing or toxic-appearing.  HENT:     Head: Normocephalic.     Right Ear: Hearing and external ear normal.     Left Ear: Hearing and external ear normal.  Eyes:     General: Lids are normal.        Right eye: No discharge.        Left eye: No discharge.     Conjunctiva/sclera: Conjunctivae normal.     Pupils: Pupils are equal, round, and reactive to light.  Neck:     Thyroid: Thyromegaly present.     Vascular: No carotid bruit.  Cardiovascular:     Rate and Rhythm: Normal rate  and regular rhythm.     Heart sounds: Normal heart sounds. No murmur heard.    No gallop.  Pulmonary:     Effort: Pulmonary effort is normal. No accessory muscle usage or respiratory distress.     Breath sounds: Normal breath sounds.  Abdominal:     General: Bowel sounds are normal. There is no distension.     Palpations: Abdomen is soft.     Tenderness: There is no abdominal tenderness.  Musculoskeletal:     Cervical back: Normal range of motion and neck supple.     Right lower leg: No edema.     Left lower leg: No edema.  Lymphadenopathy:     Cervical: No cervical adenopathy.  Skin:    General: Skin is warm and dry.  Neurological:     Mental Status: She is alert and oriented to person, place, and time.     Deep Tendon Reflexes: Reflexes are normal and symmetric.     Reflex Scores:      Brachioradialis reflexes are 2+ on the right side and 2+ on the left side.      Patellar reflexes are 2+ on the right side and 2+ on the left side. Psychiatric:        Attention and Perception: Attention normal.        Mood and Affect: Mood normal.        Speech: Speech normal.        Behavior: Behavior normal. Behavior is cooperative.        Thought Content: Thought content normal.     Results for orders placed or performed in visit on 02/03/23  TSH  Result Value Ref Range   TSH 117.000 (H) 0.450 - 4.500 uIU/mL  T4, free  Result Value Ref Range   Free T4 0.78 (L) 0.82 - 1.77 ng/dL      Assessment & Plan:   Problem List Items Addressed This Visit       Endocrine   Postablative hypothyroidism - Primary    Ongoing, ablation on 11/20/22 for hyperthyroid.  Currently taking Levothyroxine 88 MCG daily, will continue this regimen and adjust as needed.  PCP is taking over treatment at this time.  Recent TSH 117 -- suspect is still elevated.  Will check today and adjust as needed.  Discussed symptoms with hypothyroid, which can include hair loss/cold intolerance/depressed mood.      Relevant  Orders   T4, free   TSH   Other Visit Diagnoses     Colon cancer screening       GI referral placed per request.   Relevant Orders   Ambulatory referral to Gastroenterology        Follow up plan: Return for as scheduled -- however go ahead and schedule her for lab visit only in 6 weeks as we will need.

## 2023-06-04 ENCOUNTER — Other Ambulatory Visit: Payer: Self-pay

## 2023-06-04 ENCOUNTER — Telehealth: Payer: Self-pay

## 2023-06-04 ENCOUNTER — Other Ambulatory Visit: Payer: Self-pay | Admitting: Nurse Practitioner

## 2023-06-04 DIAGNOSIS — E890 Postprocedural hypothyroidism: Secondary | ICD-10-CM

## 2023-06-04 DIAGNOSIS — Z1211 Encounter for screening for malignant neoplasm of colon: Secondary | ICD-10-CM

## 2023-06-04 MED ORDER — NA SULFATE-K SULFATE-MG SULF 17.5-3.13-1.6 GM/177ML PO SOLN: 1.0000 | Freq: Once | ORAL | 0 refills | Status: AC

## 2023-06-04 MED ORDER — LEVOTHYROXINE SODIUM 100 MCG PO TABS: 100.0000 ug | ORAL_TABLET | Freq: Every day | ORAL | 3 refills | Status: DC

## 2023-06-04 NOTE — Progress Notes (Signed)
Contacted via MyChart -- lab only visit in 6 weeks please   Good afternoon Tonya Estrada, the good news is your thyroid lab (TSH mainly) is coming down from 117 to 11.60, but still sluggish and elevated.  Free T4 is improved.  I recommend we increase Levothyroxine a little more to help lower TSH and help your symptoms. We will try a little increase to 100 MCG and see if this improves.  Will recheck labs outpatient in 6 weeks.  Staff will call to schedule.   Any questions? Keep being amazing!!  Thank you for allowing me to participate in your care.  I appreciate you. Kindest regards, Patty Lopezgarcia

## 2023-06-04 NOTE — Telephone Encounter (Signed)
Gastroenterology Pre-Procedure Review  Request Date: 06/17/23 Requesting Physician: Dr. Tobi Bastos  PATIENT REVIEW QUESTIONS: The patient responded to the following health history questions as indicated:    1. Are you having any GI issues? no 2. Do you have a personal history of Polyps? no 3. Do you have a family history of Colon Cancer or Polyps? yes (mother polyps) 4. Diabetes Mellitus? no 5. Joint replacements in the past 12 months?no 6. Major health problems in the past 3 months?no 7. Any artificial heart valves, MVP, or defibrillator?no    MEDICATIONS & ALLERGIES:    Patient reports the following regarding taking any anticoagulation/antiplatelet therapy:   Plavix, Coumadin, Eliquis, Xarelto, Lovenox, Pradaxa, Brilinta, or Effient? no Aspirin? no  Patient confirms/reports the following medications:  Current Outpatient Medications  Medication Sig Dispense Refill   levonorgestrel (MIRENA) 20 MCG/24HR IUD 1 each by Intrauterine route once.     levothyroxine (SYNTHROID) 88 MCG tablet Take 1 tablet (88 mcg total) by mouth daily before breakfast. 90 tablet 1   loratadine (CLARITIN) 10 MG tablet Take 10 mg by mouth as needed for allergies.     mometasone (ELOCON) 0.1 % cream Apply 1 application topically as needed.     Multiple Vitamin (MULTIVITAMIN) tablet Take 1 tablet by mouth daily.     No current facility-administered medications for this visit.    Patient confirms/reports the following allergies:  Allergies  Allergen Reactions   Hydrocodone Hives    No orders of the defined types were placed in this encounter.   AUTHORIZATION INFORMATION Primary Insurance: 1D#: Group #:  Secondary Insurance: 1D#: Group #:  SCHEDULE INFORMATION: Date: 06/17/23 Time: Location: [facility]

## 2023-06-07 NOTE — Progress Notes (Signed)
Appointment already made on 07/15/2023 @ 8:20 am for labs.

## 2023-06-17 ENCOUNTER — Ambulatory Visit
Admission: RE | Admit: 2023-06-17 | Discharge: 2023-06-17 | Disposition: A | Discharge status: Home / Self Care | Payer: BC Managed Care – PPO | Attending: Gastroenterology | Admitting: Gastroenterology

## 2023-06-17 ENCOUNTER — Ambulatory Visit: Payer: Self-pay | Admitting: Certified Registered Nurse Anesthetist

## 2023-06-17 ENCOUNTER — Encounter: Payer: Self-pay | Admitting: Gastroenterology

## 2023-06-17 ENCOUNTER — Ambulatory Visit: Payer: BC Managed Care – PPO | Admitting: Certified Registered Nurse Anesthetist

## 2023-06-17 ENCOUNTER — Encounter
Admission: RE | Disposition: A | Discharge status: Home / Self Care | Payer: Self-pay | Source: Home / Self Care | Attending: Gastroenterology

## 2023-06-17 DIAGNOSIS — D122 Benign neoplasm of ascending colon: Secondary | ICD-10-CM | POA: Insufficient documentation

## 2023-06-17 DIAGNOSIS — Z1211 Encounter for screening for malignant neoplasm of colon: Secondary | ICD-10-CM

## 2023-06-17 DIAGNOSIS — D126 Benign neoplasm of colon, unspecified: Secondary | ICD-10-CM

## 2023-06-17 DIAGNOSIS — D125 Benign neoplasm of sigmoid colon: Secondary | ICD-10-CM | POA: Diagnosis not present

## 2023-06-17 HISTORY — PX: POLYPECTOMY: SHX5525

## 2023-06-17 HISTORY — PX: COLONOSCOPY WITH PROPOFOL: SHX5780

## 2023-06-17 LAB — POCT PREGNANCY, URINE: Preg Test, Ur: NEGATIVE

## 2023-06-17 SURGERY — COLONOSCOPY WITH PROPOFOL
Anesthesia: General

## 2023-06-17 MED ORDER — PROPOFOL 10 MG/ML IV BOLUS
INTRAVENOUS | Status: DC | PRN
  Administered 2023-06-17: 20 mg via INTRAVENOUS
  Administered 2023-06-17: 60 mg via INTRAVENOUS
  Administered 2023-06-17: 20 mg via INTRAVENOUS

## 2023-06-17 MED ORDER — PROPOFOL 1000 MG/100ML IV EMUL
INTRAVENOUS | Status: AC
  Filled 2023-06-17: qty 200

## 2023-06-17 MED ORDER — GLYCOPYRROLATE 0.2 MG/ML IJ SOLN
INTRAMUSCULAR | Status: AC
  Filled 2023-06-17: qty 1

## 2023-06-17 MED ORDER — LIDOCAINE HCL (PF) 2 % IJ SOLN
INTRAMUSCULAR | Status: AC
  Filled 2023-06-17: qty 15

## 2023-06-17 MED ORDER — SODIUM CHLORIDE 0.9 % IV SOLN: INTRAVENOUS | Status: DC

## 2023-06-17 MED ORDER — PROPOFOL 500 MG/50ML IV EMUL
INTRAVENOUS | Status: DC | PRN
  Administered 2023-06-17: 140 ug/kg/min via INTRAVENOUS

## 2023-06-17 MED ORDER — LIDOCAINE HCL (CARDIAC) PF 100 MG/5ML IV SOSY
PREFILLED_SYRINGE | INTRAVENOUS | Status: DC | PRN
  Administered 2023-06-17: 50 mg via INTRAVENOUS

## 2023-06-17 NOTE — Anesthesia Postprocedure Evaluation (Signed)
Anesthesia Post Note  Patient: Tonya Estrada  Procedure(s) Performed: COLONOSCOPY WITH PROPOFOL POLYPECTOMY  Patient location during evaluation: PACU Anesthesia Type: General Level of consciousness: awake Pain management: satisfactory to patient Vital Signs Assessment: post-procedure vital signs reviewed and stable Respiratory status: spontaneous breathing and nonlabored ventilation Cardiovascular status: blood pressure returned to baseline Anesthetic complications: no   No notable events documented.   Last Vitals:  Vitals:   06/17/23 0832 06/17/23 0843  BP: 101/74 117/79  Pulse: 93 90  Resp: 15 17  Temp: (!) 36.2 C   SpO2: 100% 100%    Last Pain:  Vitals:   06/17/23 0832  TempSrc: Temporal  PainSc: 0-No pain                 VAN STAVEREN,Analeise Mccleery

## 2023-06-17 NOTE — Transfer of Care (Signed)
Immediate Anesthesia Transfer of Care Note  Patient: Tonya Estrada  Procedure(s) Performed: COLONOSCOPY WITH PROPOFOL POLYPECTOMY  Patient Location: PACU  Anesthesia Type:General  Level of Consciousness: awake, alert , and oriented  Airway & Oxygen Therapy: Patient Spontanous Breathing  Post-op Assessment: Report given to RN and Post -op Vital signs reviewed and stable  Post vital signs: Reviewed and stable  Last Vitals:  Vitals Value Taken Time  BP 101/74 06/17/23 0833  Temp    Pulse 95 06/17/23 0833  Resp 18 06/17/23 0833  SpO2 99 % 06/17/23 0833  Vitals shown include unfiled device data.  Last Pain:  Vitals:   06/17/23 0724  TempSrc: Temporal  PainSc: 0-No pain         Complications: No notable events documented.

## 2023-06-17 NOTE — H&P (Signed)
Wyline Mood, MD [address], [address], [place], [place], [address] [address], [address], [place], [place], [address] Phone: [phone]  Fax: [phone]  Primary Care Physician:  Marjie Skiff, NP   Pre-Procedure History & Physical: HPI:  Tonya Estrada is a 47 y.o. female is here for an colonoscopy.   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 - NEGATIVE FOR ATYPIA AND MALIGNANCY.   DILATION AND CURETTAGE OF UTERUS     INTRAUTERINE DEVICE (IUD) INSERTION  10/11/2014   KNEE ARTHROSCOPY     MELANOMA EXCISION  06/2014   both legs   WISDOM TOOTH EXTRACTION      Prior to Admission medications   Medication Sig Start Date End Date Taking? Authorizing Provider  levothyroxine (SYNTHROID) 100 MCG tablet Take 1 tablet (100 mcg total) by mouth daily. 06/04/23  Yes Cannady, Corrie Dandy T, NP  levonorgestrel (MIRENA) 20 MCG/24HR IUD 1 each by Intrauterine route once.    [provider]  loratadine (CLARITIN) 10 MG tablet Take 10 mg by mouth as needed for allergies.    [provider]  mometasone (ELOCON) 0.1 % cream Apply 1 application topically as needed. 07/22/20   [provider]  Multiple Vitamin (MULTIVITAMIN) tablet Take 1 tablet by mouth daily.    [provider]    Allergies as of 06/04/2023 - Review Complete 06/03/2023  Allergen Reaction Noted   Hydrocodone Hives 08/30/2015    Family History  Problem Relation Age of Onset   Rheum arthritis Mother    Basal cell carcinoma Father 49   Other Sister 82       Radial sclerosing lesions (RSLs)   Multiple myeloma Maternal Grandmother    Heart disease Maternal Grandmother        MI   Hypertension Maternal Grandmother    Diabetes Maternal Grandfather        type 1    Heart disease Maternal Grandfather    Cancer Paternal Grandfather 22       brain   Breast cancer Other 51    Social History   Socioeconomic History   Marital status: Married    Spouse name: Not on file   Number of children: 2   Years of education: Not on file   Highest education level: Master's degree (e.g., MA, MS, MEng, MEd, MSW, MBA)  Occupational History   Occupation: Runner, broadcasting/film/video  Tobacco Use   Smoking status: Never   Smokeless tobacco: Never  Vaping Use   Vaping status: Never Used  Substance and Sexual Activity   Alcohol use: Yes    Alcohol/week: 5.0 standard drinks of alcohol    Types: 5 Glasses of wine per week   Drug use: No   Sexual activity: Yes    Partners: Male    Birth control/protection: I.U.D.    Comment: Mirena  Other Topics Concern   Not on file  Social History Narrative   Not on file   Social Determinants of Health   Financial Resource Strain: Low Risk  (03/06/2023)   Overall Financial Resource Strain (CARDIA)    Difficulty of Paying Living Expenses: Not hard at all  Food Insecurity: No Food Insecurity (03/06/2023)   Hunger Vital Sign    Worried About Running Out of  Food in the Last Year: Never true    Ran Out of Food in the Last Year: Never true  Transportation Needs: No Transportation Needs (03/06/2023)   PRAPARE - Administrator, Civil Service (Medical): No    Lack of Transportation (Non-Medical): No  Physical Activity: Insufficiently Active (03/06/2023)   Exercise Vital Sign    Days of Exercise per Week: 2 days    Minutes of Exercise per Session: 10 min  Stress: No Stress Concern Present (03/06/2023)   [employer] of Occupational Health - Occupational Stress Questionnaire    Feeling of Stress : Only a little  Social Connections: Moderately Integrated (03/06/2023)   Social Connection and Isolation Panel [NHANES]    Frequency of Communication with Friends and Family: Once a week    Frequency of Social Gatherings with Friends and  Family: Once a week    Attends Religious Services: More than 4 times per year    Active Member of [employer] or Organizations: Yes    Attends Engineer, structural: More than 4 times per year    Marital Status: Married  Catering manager Violence: Not on file    Review of Systems: See HPI, otherwise negative ROS  Physical Exam: BP 126/87   Pulse (!) 106   Temp 98.2 F (36.8 C) (Temporal)   Resp 16   Ht 5\' 6"  (1.676 m)   Wt 55.6 kg   SpO2 98%   BMI 19.79 kg/m  General:   Alert,  pleasant and cooperative in NAD Head:  Normocephalic and atraumatic. Neck:  Supple; no masses or thyromegaly. Lungs:  Clear throughout to auscultation, normal respiratory effort.    Heart:  +S1, +S2, Regular rate and rhythm, No edema. Abdomen:  Soft, nontender and nondistended. Normal bowel sounds, without guarding, and without rebound.   Neurologic:  Alert and  oriented x4;  grossly normal neurologically.  Impression/Plan: Tonya Estrada is here for an colonoscopy to be performed for Screening colonoscopy average risk   Risks, benefits, limitations, and alternatives regarding  colonoscopy have been reviewed with the patient.  Questions have been answered.  All parties agreeable.   Wyline Mood, MD  06/17/2023, 8:07 AM

## 2023-06-17 NOTE — Op Note (Signed)
[facility] Gastroenterology Patient Name: Tonya Estrada Procedure Date: 06/17/2023 8:11 AM MRN: [id number] Account #: [id number] Date of Birth: 1976-09-07 Admit Type: Outpatient Age: 47 Room: [facility] ENDO ROOM 3 Gender: Female Note Status: Finalized Instrument Name: Prentice Docker [id number] Procedure:             Colonoscopy Indications:           Screening for colorectal malignant neoplasm Providers:             Wyline Mood MD, MD Referring MD:          Wyline Mood MD, MD (Referring MD), Dorie Rank. Har[facility] Dark                         (Referring MD) Medicines:             Monitored Anesthesia Care Complications:         No immediate complications. Procedure:             Pre-Anesthesia Assessment:                        - Prior to the procedure, a History and Physical was                         performed, and patient medications, allergies and                         sensitivities were reviewed. The patient's tolerance                         of previous anesthesia was reviewed.                        - The risks and benefits of the procedure and the                         sedation options and risks were discussed with the                         patient. All questions were answered and informed                         consent was obtained.                        - ASA Grade Assessment: II - A patient with mild                         systemic disease.                        After obtaining informed consent, the colonoscope was                         passed under direct vision. Throughout the procedure,                         the patient's blood pressure, pulse, and oxygen                         saturations were  monitored continuously. The                         Colonoscope was introduced through the anus and                         advanced to the the cecum, identified by the                         appendiceal orifice. The colonoscopy was performed                          with ease. The patient tolerated the procedure well.                         The quality of the bowel preparation was excellent.                         The ileocecal valve, appendiceal orifice, and rectum                         were photographed. Findings:      The perianal and digital rectal examinations were normal.      Two sessile polyps were found in the sigmoid colon and ascending colon.       The polyps were 4 to 6 mm in size. These polyps were removed with a cold       snare. Resection and retrieval were complete.      The exam was otherwise without abnormality on direct and retroflexion       views. Impression:            - Two 4 to 6 mm polyps in the sigmoid colon and in the                         ascending colon, removed with a cold snare. Resected                         and retrieved.                        - The examination was otherwise normal on direct and                         retroflexion views. Recommendation:        - Discharge patient to home (with escort).                        - Resume previous diet.                        - Continue present medications.                        - Await pathology results.                        - Repeat colonoscopy for surveillance based on                         pathology results. Procedure  Code(s):     --- Professional ---                        [phone], Colonoscopy, flexible; with removal of                         tumor(s), polyp(s), or other lesion(s) by snare                         technique Diagnosis Code(s):     --- Professional ---                        Z12.11, Encounter for screening for malignant neoplasm                         of colon                        D12.5, Benign neoplasm of sigmoid colon                        D12.2, Benign neoplasm of ascending colon CPT copyright 2022 American Medical Association. All rights reserved. The codes documented in this report are preliminary and upon coder review may   be revised to meet current compliance requirements. Wyline Mood, MD Wyline Mood MD, MD 06/17/2023 8:31:22 AM This report has been signed electronically. Number of Addenda: 0 Note Initiated On: 06/17/2023 8:11 AM Scope Withdrawal Time: 0 hours 10 minutes 9 seconds  Total Procedure Duration: 0 hours 13 minutes 40 seconds  Estimated Blood Loss:  Estimated blood loss: none.      [facility]

## 2023-06-17 NOTE — Anesthesia Preprocedure Evaluation (Signed)
Anesthesia Evaluation  Patient identified by MRN, date of birth, ID band Patient awake    Reviewed: Allergy & Precautions, NPO status , Patient's Chart, lab work & pertinent test results  Airway Mallampati: III  TM Distance: >3 FB Neck ROM: Full    Dental  (+) Teeth Intact   Pulmonary neg pulmonary ROS   Pulmonary exam normal        Cardiovascular Exercise Tolerance: Good negative cardio ROS Normal cardiovascular exam Rhythm:Regular Rate:Normal     Neuro/Psych negative neurological ROS  negative psych ROS   GI/Hepatic negative GI ROS, Neg liver ROS,,,  Endo/Other  negative endocrine ROSHypothyroidism    Renal/GU negative Renal ROS  negative genitourinary   Musculoskeletal negative musculoskeletal ROS (+)    Abdominal Normal abdominal exam  (+)   Peds negative pediatric ROS (+)  Hematology negative hematology ROS (+)   Anesthesia Other Findings Past Medical History: No date: Allergy No date: Cancer [facility])     Comment:  melanoma  Past Surgical History: 09/07/2022: breast biopsy; Left     Comment:  PSEUDOANGIOMATOUS STROMAL HYPERPLASIA AND FOCAL CHANGES               SUGGESTIVE OF ORGANIZING FAT NECROSIS. - NEGATIVE FOR               ATYPIA AND MALIGNANCY. No date: DILATION AND CURETTAGE OF UTERUS 10/11/2014: INTRAUTERINE DEVICE (IUD) INSERTION No date: KNEE ARTHROSCOPY 06/2014: MELANOMA EXCISION     Comment:  both legs No date: WISDOM TOOTH EXTRACTION  BMI    Body Mass Index: 19.79 kg/m      Reproductive/Obstetrics negative OB ROS                             Anesthesia Physical Anesthesia Plan  ASA: 2  Anesthesia Plan: General   Post-op Pain Management:    Induction: Intravenous  PONV Risk Score and Plan: Propofol infusion and TIVA  Airway Management Planned: Natural Airway  Additional Equipment:   Intra-op Plan:   Post-operative Plan:   Informed Consent: I  have reviewed the patients History and Physical, chart, labs and discussed the procedure including the risks, benefits and alternatives for the proposed anesthesia with the patient or authorized representative who has indicated his/her understanding and acceptance.     Dental Advisory Given  Plan Discussed with: CRNA and Surgeon  Anesthesia Plan Comments:        Anesthesia Quick Evaluation

## 2023-06-18 ENCOUNTER — Encounter: Payer: Self-pay | Admitting: Gastroenterology

## 2023-06-21 ENCOUNTER — Encounter: Payer: Self-pay | Admitting: Gastroenterology

## 2023-06-26 NOTE — Patient Instructions (Signed)

## 2023-06-28 ENCOUNTER — Encounter: Payer: Self-pay | Admitting: Nurse Practitioner

## 2023-06-28 ENCOUNTER — Ambulatory Visit (INDEPENDENT_AMBULATORY_CARE_PROVIDER_SITE_OTHER): Payer: BC Managed Care – PPO | Admitting: Nurse Practitioner

## 2023-06-28 VITALS — BP 103/69 | HR 79 | Temp 98.3°F | Ht 66.0 in | Wt 128.4 lb

## 2023-06-28 DIAGNOSIS — Z136 Encounter for screening for cardiovascular disorders: Secondary | ICD-10-CM

## 2023-06-28 DIAGNOSIS — R002 Palpitations: Secondary | ICD-10-CM | POA: Diagnosis not present

## 2023-06-28 DIAGNOSIS — E890 Postprocedural hypothyroidism: Secondary | ICD-10-CM

## 2023-06-28 DIAGNOSIS — Z85820 Personal history of malignant melanoma of skin: Secondary | ICD-10-CM | POA: Diagnosis not present

## 2023-06-28 DIAGNOSIS — Z13220 Encounter for screening for lipoid disorders: Secondary | ICD-10-CM

## 2023-06-28 DIAGNOSIS — Z0000 Encounter for general adult medical examination without abnormal findings: Secondary | ICD-10-CM

## 2023-06-28 NOTE — Assessment & Plan Note (Signed)
Continue annual skin exams with dermatology.

## 2023-06-28 NOTE — Assessment & Plan Note (Signed)
Ongoing, ablation on 11/20/22 for hyperthyroid.  Currently taking Levothyroxine 100 MCG daily, will continue this regimen and adjust as needed.  PCP is taking over treatment at this time.  Recent TSH 11.6 -- we adjusted dosing.  Will check today and adjust as needed.

## 2023-06-28 NOTE — Progress Notes (Signed)
BP 103/69   Pulse 79   Temp 98.3 F (36.8 C) (Oral)   Ht 5\' 6"  (1.676 m)   Wt 128 lb 6.4 oz (58.2 kg)   SpO2 98%   BMI 20.72 kg/m    Subjective:    Patient ID: GERLEAN Estrada, female    DOB: 04/19/1976, 47 y.o.   MRN: [id number]  HPI: Tonya Estrada is a 47 y.o. female presenting on 06/28/2023 for comprehensive medical examination. Current medical complaints include:none  She currently lives with: significant other Menopausal Symptoms: no  HYPOTHYROIDISM Currently taking Levothyroxine 100 MCG, recent dose change due to TSH 11.600 on 06/03/23.  Was followed by endo, now PCP is taking over regimen.  Last endo 02/03/23.    Goes for annual skin exams due to history of melanoma.   Thyroid control status:stable Satisfied with current treatment? yes Medication side effects: no Medication compliance: good compliance Etiology of hypothyroidism: postablative  Recent dose adjustment: yes Fatigue: no Cold intolerance: yes Heat intolerance: no Weight gain: no Weight loss: no Constipation: no Diarrhea/loose stools: no Palpitations:  has had 2 recently -- overall these have been stable Lower extremity edema: no Anxiety/depressed mood: no   Depression Screen done today and results listed below:     06/28/2023    4:09 PM 06/03/2023   10:17 AM 03/09/2023    8:23 AM 09/07/2022    1:26 PM 06/24/2022    3:22 PM  Depression screen PHQ 2/9  Decreased Interest 0 0 0 0 0  Down, Depressed, Hopeless 0 1 0 0 0  PHQ - 2 Score 0 1 0 0 0  Altered sleeping 0 0 0 0   Tired, decreased energy 0 0 0    Change in appetite 0 0 0 0   Feeling bad or failure about yourself  0 0 0 0   Trouble concentrating 0 0 0 0   Moving slowly or fidgety/restless 0 0 0 0   Suicidal thoughts 0 0 0 0   PHQ-9 Score 0 1 0 0   Difficult doing work/chores Not difficult at all Not difficult at all Not difficult at all Not difficult at all       06/28/2023    4:09 PM 06/03/2023   10:17 AM 03/09/2023    8:23 AM  09/07/2022    1:26 PM  GAD 7 : Generalized Anxiety Score  Nervous, Anxious, on Edge 0 0 0 0  Control/stop worrying 0 0 0 0  Worry too much - different things 0 0 0 0  Trouble relaxing 0 0 0 0  Restless 0 0 0 0  Easily annoyed or irritable 0 0 0 0  Afraid - awful might happen 0 0 0 0  Total GAD 7 Score 0 0 0 0  Anxiety Difficulty Not difficult at all Not difficult at all Not difficult at all Not difficult at all      06/21/2020    2:03 PM 09/07/2022    1:26 PM 03/09/2023    8:23 AM 06/03/2023   10:17 AM 06/28/2023    4:08 PM  Fall Risk  Falls in the past year? 0 0 0 0 0  Was there an injury with Fall? 0 0 0 0 0  Fall Risk Category Calculator 0 0 0 0 0  Fall Risk Category (Retired) Low Low     (RETIRED) Patient Fall Risk Level Low fall risk Low fall risk     Patient at Risk for Falls Due to No  Fall Risks No Fall Risks No Fall Risks No Fall Risks No Fall Risks  Fall risk Follow up  Falls evaluation completed Falls evaluation completed Falls evaluation completed Falls evaluation completed    Past Medical History:  Past Medical History:  Diagnosis Date   Allergy    Cancer (HCC)    melanoma   Thyroid disease     Surgical History:  Past Surgical History:  Procedure Laterality Date   breast biopsy Left 09/07/2022   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 - NEGATIVE FOR ATYPIA AND MALIGNANCY.   COLONOSCOPY WITH PROPOFOL N/A 06/17/2023   Procedure: COLONOSCOPY WITH PROPOFOL;  Surgeon: Wyline Mood, MD;  Location[facility] ENDOSCOPY;  Service: Gastroenterology;  Laterality: N/A;   DILATION AND CURETTAGE OF UTERUS     INTRAUTERINE DEVICE (IUD) INSERTION  10/11/2014   KNEE ARTHROSCOPY     MELANOMA EXCISION  06/2014   both legs   POLYPECTOMY  06/17/2023   Procedure: POLYPECTOMY;  Surgeon: Wyline Mood, MD;  Location: [facility] ENDOSCOPY;  Service: Gastroenterology;;   [place] TOOTH EXTRACTION      Medications:  Current Outpatient  Medications on File Prior to Visit  Medication Sig   levonorgestrel (MIRENA) 20 MCG/24HR IUD 1 each by Intrauterine route once.   levothyroxine (SYNTHROID) 100 MCG tablet Take 1 tablet (100 mcg total) by mouth daily.   loratadine (CLARITIN) 10 MG tablet Take 10 mg by mouth as needed for allergies.   mometasone (ELOCON) 0.1 % cream Apply 1 application topically as needed.   Multiple Vitamin (MULTIVITAMIN) tablet Take 1 tablet by mouth daily.   No current facility-administered medications on file prior to visit.    Allergies:  Allergies  Allergen Reactions   Hydrocodone Hives    Social History:  Social History   Socioeconomic History   Marital status: Married    Spouse name: Not on file   Number of children: 2   Years of education: Not on file   Highest education level: Master's degree (e.g., MA, MS, MEng, MEd, MSW, MBA)  Occupational History   Occupation: Teacher  Tobacco Use   Smoking status: Never   Smokeless tobacco: Never  Vaping Use   Vaping status: Never Used  Substance and Sexual Activity   Alcohol use: Yes    Alcohol/week: 5.0 standard drinks of alcohol    Types: 5 Glasses of wine per week   Drug use: No   Sexual activity: Yes    Partners: Male    Birth control/protection: I.U.D.    Comment: Mirena  Other Topics Concern   Not on file  Social History Narrative   Not on file   Social Determinants of Health   Financial Resource Strain: Low Risk  (03/06/2023)   Overall Financial Resource Strain (CARDIA)    Difficulty of Paying Living Expenses: Not hard at all  Food Insecurity: No Food Insecurity (03/06/2023)   Hunger Vital Sign    Worried About Running Out of Food in the Last Year: Never true    Ran Out of Food in the Last Year: Never true  Transportation Needs: No Transportation Needs (03/06/2023)   PRAPARE - Administrator, Civil Service (Medical): No    Lack of Transportation (Non-Medical): No  Physical Activity: Insufficiently Active  (03/06/2023)   Exercise Vital Sign    Days of Exercise per Week: 2 days    Minutes of Exercise per Session: 10 min  Stress: No Stress Concern Present (03/06/2023)   [employer]  of Occupational Health - Occupational Stress Questionnaire    Feeling of Stress : Only a little  Social Connections: Moderately Integrated (03/06/2023)   Social Connection and Isolation Panel [NHANES]    Frequency of Communication with Friends and Family: Once a week    Frequency of Social Gatherings with Friends and Family: Once a week    Attends Religious Services: More than 4 times per year    Active Member of [employer] or Organizations: Yes    Attends Engineer, structural: More than 4 times per year    Marital Status: Married  Catering manager Violence: Not on file   Social History   Tobacco Use  Smoking Status Never  Smokeless Tobacco Never   Social History   Substance and Sexual Activity  Alcohol Use Yes   Alcohol/week: 5.0 standard drinks of alcohol   Types: 5 Glasses of wine per week    Family History:  Family History  Problem Relation Age of Onset   Rheum arthritis Mother    Arthritis Mother    Basal cell carcinoma Father 45   Varicose Veins Father    Other Sister 40       Radial sclerosing lesions (RSLs)   Multiple myeloma Maternal Grandmother    Heart disease Maternal Grandmother        MI   Hypertension Maternal Grandmother    Diabetes Maternal Grandfather        type 1   Heart disease Maternal Grandfather    Hearing loss Maternal Grandfather    Varicose Veins Paternal Grandmother    Cancer Paternal Grandfather 10       brain   Early death Paternal Grandfather    Breast cancer Other 70   Hearing loss Maternal Aunt     Past medical history, surgical history, medications, allergies, family history and social history reviewed with patient today and changes made to appropriate areas of the chart.   ROS All other ROS negative except what is listed above and in the HPI.       Objective:    BP 103/69   Pulse 79   Temp 98.3 F (36.8 C) (Oral)   Ht 5\' 6"  (1.676 m)   Wt 128 lb 6.4 oz (58.2 kg)   SpO2 98%   BMI 20.72 kg/m   Wt Readings from Last 3 Encounters:  06/28/23 128 lb 6.4 oz (58.2 kg)  06/17/23 122 lb 9.6 oz (55.6 kg)  06/03/23 126 lb 12.8 oz (57.5 kg)    Physical Exam Vitals and nursing note reviewed. Exam conducted with a chaperone present.  Constitutional:      General: She is awake. She is not in acute distress.    Appearance: She is well-developed and well-groomed. She is not ill-appearing or toxic-appearing.  HENT:     Head: Normocephalic and atraumatic.     Right Ear: Hearing, tympanic membrane, ear canal and external ear normal. No drainage.     Left Ear: Hearing, tympanic membrane, ear canal and external ear normal. No drainage.     Nose: Nose normal.     Right Sinus: No maxillary sinus tenderness or frontal sinus tenderness.     Left Sinus: No maxillary sinus tenderness or frontal sinus tenderness.     Mouth/Throat:     Mouth: Mucous membranes are moist.     Pharynx: Oropharynx is clear. Uvula midline. No pharyngeal swelling, oropharyngeal exudate or posterior oropharyngeal erythema.  Eyes:     General: Lids are normal.  Right eye: No discharge.        Left eye: No discharge.     Extraocular Movements: Extraocular movements intact.     Conjunctiva/sclera: Conjunctivae normal.     Pupils: Pupils are equal, round, and reactive to light.     Visual Fields: Right eye visual fields normal and left eye visual fields normal.  Neck:     Thyroid: No thyromegaly.     Vascular: No carotid bruit.     Trachea: Trachea normal.  Cardiovascular:     Rate and Rhythm: Normal rate and regular rhythm.     Heart sounds: Normal heart sounds. No murmur heard.    No gallop.  Pulmonary:     Effort: Pulmonary effort is normal. No accessory muscle usage or respiratory distress.     Breath sounds: Normal breath sounds.  Chest:  Breasts:     Right: Normal.     Left: Normal.  Abdominal:     General: Bowel sounds are normal.     Palpations: Abdomen is soft. There is no hepatomegaly or splenomegaly.     Tenderness: There is no abdominal tenderness.  Musculoskeletal:        General: Normal range of motion.     Cervical back: Normal range of motion and neck supple.     Right lower leg: No edema.     Left lower leg: No edema.  Lymphadenopathy:     Head:     Right side of head: No submental, submandibular, tonsillar, preauricular or posterior auricular adenopathy.     Left side of head: No submental, submandibular, tonsillar, preauricular or posterior auricular adenopathy.     Cervical: No cervical adenopathy.     Upper Body:     Right upper body: No supraclavicular, axillary or pectoral adenopathy.     Left upper body: No supraclavicular, axillary or pectoral adenopathy.  Skin:    General: Skin is warm and dry.     Capillary Refill: Capillary refill takes less than 2 seconds.     Findings: No rash.  Neurological:     Mental Status: She is alert and oriented to person, place, and time.     Gait: Gait is intact.     Deep Tendon Reflexes: Reflexes are normal and symmetric.     Reflex Scores:      Brachioradialis reflexes are 2+ on the right side and 2+ on the left side.      Patellar reflexes are 2+ on the right side and 2+ on the left side. Psychiatric:        Attention and Perception: Attention normal.        Mood and Affect: Mood normal.        Speech: Speech normal.        Behavior: Behavior normal. Behavior is cooperative.        Thought Content: Thought content normal.        Judgment: Judgment normal.    Results for orders placed or performed during the hospital encounter of 06/17/23  Pregnancy, urine POC  Result Value Ref Range   Preg Test, Ur NEGATIVE NEGATIVE      Assessment & Plan:   Problem List Items Addressed This Visit       Endocrine   Postablative hypothyroidism - Primary    Ongoing, ablation  on 11/20/22 for hyperthyroid.  Currently taking Levothyroxine 100 MCG daily, will continue this regimen and adjust as needed.  PCP is taking over treatment at this time.  Recent TSH  11.6 -- we adjusted dosing.  Will check today and adjust as needed.        Relevant Orders   T4, free   TSH     Other   Heart palpitations    No changes, asymptomatic.  Wishes not to have cardiology referral at this time.  Will obtain if increased episodes or other symptoms present.  Is aware to schedule appointment if any changes present.      Relevant Orders   Comprehensive metabolic panel   CBC with Differential/Platelet   History of melanoma    Continue annual skin exams with dermatology.      Other Visit Diagnoses     Encounter for lipid screening for cardiovascular disease       Lipid panel on labs today.   Relevant Orders   Lipid Panel w/o Chol/HDL Ratio   Comprehensive metabolic panel   Encounter for annual physical exam       Annual physical today with labs and health maintenance reviewed, discussed with patient.        Follow up plan: Return in about 6 months (around 12/29/2023) for Hypothyroid + can schedule her for one year annual physical.   LABORATORY TESTING:  - Pap smear: up to date  IMMUNIZATIONS:   - Tdap: Tetanus vaccination status reviewed: last tetanus booster within 10 years. - Influenza: Up to date - Pneumovax: Not applicable - Prevnar: Not applicable - COVID: Up to date - HPV: Not applicable - Shingrix vaccine: Not applicable  SCREENING: -Mammogram: Up to date  - Colonoscopy: Up to date  - Bone Density: Not applicable  -Hearing Test: Not applicable  -Spirometry: Not applicable   PATIENT COUNSELING:   Advised to take 1 mg of folate supplement per day if capable of pregnancy.   Sexuality: Discussed sexually transmitted diseases, partner selection, use of condoms, avoidance of unintended pregnancy  and contraceptive alternatives.   Advised to avoid cigarette  smoking.  I discussed with the patient that most people either abstain from alcohol or drink within safe limits (<=14/week and <=4 drinks/occasion for males, <=7/weeks and <= 3 drinks/occasion for females) and that the risk for alcohol disorders and other health effects rises proportionally with the number of drinks per week and how often a drinker exceeds daily limits.  Discussed cessation/primary prevention of drug use and availability of treatment for abuse.   Diet: Encouraged to adjust caloric intake to maintain  or achieve ideal body weight, to reduce intake of dietary saturated fat and total fat, to limit sodium intake by avoiding high sodium foods and not adding table salt, and to maintain adequate dietary potassium and calcium preferably from fresh fruits, vegetables, and low-fat dairy products.    Stressed the importance of regular exercise  Injury prevention: Discussed safety belts, safety helmets, smoke detector, smoking near bedding or upholstery.   Dental health: Discussed importance of regular tooth brushing, flossing, and dental visits.    NEXT PREVENTATIVE PHYSICAL DUE IN 1 YEAR. Return in about 6 months (around 12/29/2023) for Hypothyroid + can schedule her for one year annual physical.

## 2023-06-28 NOTE — Assessment & Plan Note (Signed)
No changes, asymptomatic.  Wishes not to have cardiology referral at this time.  Will obtain if increased episodes or other symptoms present.  Is aware to schedule appointment if any changes present. 

## 2023-06-29 ENCOUNTER — Other Ambulatory Visit: Payer: Self-pay | Admitting: Nurse Practitioner

## 2023-06-29 DIAGNOSIS — E890 Postprocedural hypothyroidism: Secondary | ICD-10-CM

## 2023-06-29 LAB — CBC WITH DIFFERENTIAL/PLATELET
Basophils Absolute: 0 10*3/uL (ref 0.0–0.2)
Basos: 1 %
EOS (ABSOLUTE): 0.1 10*3/uL (ref 0.0–0.4)
Eos: 1 %
Hematocrit: 38.4 % (ref 34.0–46.6)
Hemoglobin: 12.6 g/dL (ref 11.1–15.9)
Immature Grans (Abs): 0 10*3/uL (ref 0.0–0.1)
Immature Granulocytes: 0 %
Lymphocytes Absolute: 1.3 10*3/uL (ref 0.7–3.1)
Lymphs: 21 %
MCH: 29.5 pg (ref 26.6–33.0)
MCHC: 32.8 g/dL (ref 31.5–35.7)
MCV: 90 fL (ref 79–97)
Monocytes Absolute: 0.5 10*3/uL (ref 0.1–0.9)
Monocytes: 8 %
Neutrophils Absolute: 4.5 10*3/uL (ref 1.4–7.0)
Neutrophils: 69 %
Platelets: 254 10*3/uL (ref 150–450)
RBC: 4.27 x10E6/uL (ref 3.77–5.28)
RDW: 11.8 % (ref 11.7–15.4)
WBC: 6.4 10*3/uL (ref 3.4–10.8)

## 2023-06-29 LAB — COMPREHENSIVE METABOLIC PANEL
ALT: 14 IU/L (ref 0–32)
AST: 18 IU/L (ref 0–40)
Albumin: 4.6 g/dL (ref 3.9–4.9)
Alkaline Phosphatase: 63 IU/L (ref 44–121)
BUN/Creatinine Ratio: 24 — ABNORMAL HIGH (ref 9–23)
BUN: 16 mg/dL (ref 6–24)
Bilirubin Total: 0.3 mg/dL (ref 0.0–1.2)
CO2: 22 mmol/L (ref 20–29)
Calcium: 9.2 mg/dL (ref 8.7–10.2)
Chloride: 104 mmol/L (ref 96–106)
Creatinine, Ser: 0.68 mg/dL (ref 0.57–1.00)
Globulin, Total: 2.6 g/dL (ref 1.5–4.5)
Glucose: 84 mg/dL (ref 70–99)
Potassium: 4.2 mmol/L (ref 3.5–5.2)
Sodium: 141 mmol/L (ref 134–144)
Total Protein: 7.2 g/dL (ref 6.0–8.5)
eGFR: 109 mL/min/{1.73_m2} (ref >59)

## 2023-06-29 LAB — TSH: TSH: 8.04 u[IU]/mL — ABNORMAL HIGH (ref 0.450–4.500)

## 2023-06-29 LAB — LIPID PANEL W/O CHOL/HDL RATIO
Cholesterol, Total: 176 mg/dL (ref 100–199)
HDL: 56 mg/dL (ref >39)
LDL Chol Calc (NIH): 99 mg/dL (ref 0–99)
Triglycerides: 120 mg/dL (ref 0–149)
VLDL Cholesterol Cal: 21 mg/dL (ref 5–40)

## 2023-06-29 LAB — T4, FREE: Free T4: 1.37 ng/dL (ref 0.82–1.77)

## 2023-06-29 MED ORDER — LEVOTHYROXINE SODIUM 125 MCG PO TABS: 125.0000 ug | ORAL_TABLET | Freq: Every day | ORAL | 4 refills | Status: DC

## 2023-06-29 NOTE — Progress Notes (Signed)
Contacted via MyChart -- needs lab only visit in 6 weeks.   Good afternoon Tonya Estrada, your labs have returned and overall all stable with exception of thyroid which is trending down, but still above goal.  We do need to increase dose again and I suspect this may get [place] where we need to be.  Stop 100 MCG dosing and start 125 MCG dosing I send in.  We will recheck labs outpatient in 6 weeks, staff will call you to schedule this.  Any questions?  Overall the remainder of labs are fabulous. Keep being stellar!!  Thank you for allowing me to participate in your care.  I appreciate you. Kindest regards, Hiromi Knodel

## 2023-06-30 NOTE — Progress Notes (Signed)
Attempted to reach patient to schedule her a 6 week labs only visit.  LVM for her to call office back to get scheduled. Put in CRM.

## 2023-07-15 ENCOUNTER — Other Ambulatory Visit: Payer: BC Managed Care – PPO

## 2023-07-20 ENCOUNTER — Ambulatory Visit: Payer: BC Managed Care – PPO | Admitting: Nurse Practitioner

## 2023-07-25 ENCOUNTER — Encounter: Payer: Self-pay | Admitting: Nurse Practitioner

## 2023-08-06 ENCOUNTER — Other Ambulatory Visit: Payer: BC Managed Care – PPO

## 2023-08-06 DIAGNOSIS — E890 Postprocedural hypothyroidism: Secondary | ICD-10-CM

## 2023-08-07 LAB — TSH: TSH: 0.423 u[IU]/mL — ABNORMAL LOW (ref 0.450–4.500)

## 2023-08-07 LAB — T4, FREE: Free T4: 1.68 ng/dL (ref 0.82–1.77)

## 2023-08-08 ENCOUNTER — Other Ambulatory Visit: Payer: Self-pay | Admitting: Nurse Practitioner

## 2023-08-08 DIAGNOSIS — E890 Postprocedural hypothyroidism: Secondary | ICD-10-CM

## 2023-08-08 MED ORDER — LEVOTHYROXINE SODIUM 112 MCG PO TABS: 112.0000 ug | ORAL_TABLET | Freq: Every day | ORAL | 2 refills | Status: DC

## 2023-08-08 NOTE — Progress Notes (Signed)
Contacted via MyChart -- 6 week lab only visit please   Good morning Tonya Estrada, your thyroid labs have returned.  We are a little too low this time, more hyper.  Lets try going into a middle ground by changing dose to 112 MCG, I have sent in a 60 day supply of this and we will recheck labs again outpatient in 6 weeks.  I suspect this may get [place] right where we need to be.  Any questions? Keep being awesome!!  Thank you for allowing me to participate in your care.  I appreciate you. Kindest regards, Marquinn Meschke

## 2023-09-20 ENCOUNTER — Other Ambulatory Visit: Payer: BC Managed Care – PPO

## 2023-09-20 DIAGNOSIS — E890 Postprocedural hypothyroidism: Secondary | ICD-10-CM

## 2023-09-21 LAB — T4, FREE: Free T4: 1.53 ng/dL (ref 0.82–1.77)

## 2023-09-21 LAB — TSH: TSH: 0.643 u[IU]/mL (ref 0.450–4.500)

## 2023-09-21 NOTE — Progress Notes (Signed)
Contacted via MyChart   Good news!!! Thyroid labs are stable at present I would recommend continue current dosing of medication:) Any questions?

## 2023-09-30 ENCOUNTER — Telehealth: Payer: Self-pay

## 2023-09-30 NOTE — Telephone Encounter (Signed)
Left message for patient to call office back to schedule annual appt after 10/20/23

## 2023-10-25 ENCOUNTER — Ambulatory Visit: Payer: BC Managed Care – PPO | Admitting: Obstetrics

## 2023-11-16 ENCOUNTER — Encounter: Payer: Self-pay | Admitting: Obstetrics

## 2023-11-16 ENCOUNTER — Ambulatory Visit (INDEPENDENT_AMBULATORY_CARE_PROVIDER_SITE_OTHER): Payer: 59 | Admitting: Obstetrics

## 2023-11-16 VITALS — BP 110/70 | HR 85 | Ht 66.0 in | Wt 129.0 lb

## 2023-11-16 DIAGNOSIS — Z01419 Encounter for gynecological examination (general) (routine) without abnormal findings: Secondary | ICD-10-CM

## 2023-11-16 DIAGNOSIS — Z1231 Encounter for screening mammogram for malignant neoplasm of breast: Secondary | ICD-10-CM

## 2023-11-16 NOTE — Progress Notes (Signed)
 GYNECOLOGY: ANNUAL EXAM   Subjective:    PCP: Valerio Melanie DASEN, NP Tonya Estrada is a 48 y.o. female [phone] who presents for annual wellness visit.   Well Woman Visit:  GYN HISTORY:  No LMP recorded. (Menstrual status: IUD).     Menstrual History: OB History     Gravida  3   Para  2   Term  2   Preterm      AB  1   Living  2      SAB  1   IAB      Ectopic      Multiple      Live Births  2           Menarche age: 67 No LMP recorded. (Menstrual status: IUD).    Not having periods with Mirena  IUD, inserted in 2020 Intermenstrual bleeding, spotting, or discharge? no Urinary incontinence? no  Sexually active: yes  Number of sexual partners: 1  Gender of sexual Partners: male  Social History   Substance and Sexual Activity  Sexual Activity Yes   Partners: Male   Birth control/protection: I.U.D.   Comment: Mirena    Contraceptive methods: IUD  Dyspareunia? no STI history: no STI/HIV testing or immunizations needed? No.   Health Maintenance: -Last pap: was normal 09/30/21 --> Any abnormals: no -Last mammogram: 03/12/23 --> Any abnormals? 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  -Vaccines:  Immunization History  Administered Date(s) Administered   DTaP 08/18/1977, 09/17/1977, 12/15/1977, 07/21/1979, 02/08/1986   Hepatitis B 06/07/1996, 07/13/1996, 12/14/1996   IPV 08/18/1977, 09/17/1977, 12/15/1977, 07/21/1979   Influenza,inj,Quad PF,6+ Mos 09/09/2015, 08/02/2019   Influenza,inj,quad, With Preservative 08/25/2017   Influenza-Unspecified 08/28/2016, 08/24/2017, 08/17/2018, 08/20/2020, 09/06/2021, 08/25/2022, 09/08/2023   MMR 11/18/1978, 07/19/1992   Moderna SARS-COV2 Booster Vaccination 09/18/2021   Moderna Sars-Covid-2 Vaccination 01/06/2020, 02/03/2020, 08/31/2020, 09/18/2021, 08/25/2022   Pfizer(Comirnaty)Fall Seasonal Vaccine 12 years  and older 09/08/2023   Tdap 03/08/2009, 12/06/2019   Last Tdap: utd / Flu: utd / COVID: utd / Gardasil: age out / Shingles (50+): n/a / PCV20: n/a -Hep C screen: pcp -Last lipid / glucose screening: pcp  > Exercise: none, not active > Dietary Supplements: Folate: No;  Calcium: No}; Vitamin D: No > Body mass index is 20.82 kg/m.  > Recent dental visit Yes.   > Seat Belt Use: Yes.   > Texting and driving? No. > Guns in the house No. > Recreational or other drug use: denied.   Social History   Tobacco Use   Smoking status: Never   Smokeless tobacco: Never  Substance Use Topics   Alcohol use: Yes    Alcohol/week: 5.0 standard drinks of alcohol    Types: 5 Glasses of wine per week   Occupation: teacher     Lives with: husband     PHQ-2 Score: In last two weeks, how often have you felt: Little interest or pleasure in doing things: Not at all (0) Feeling down, depressed or hopeless: Not at all (0) Score: 0  GAD-2 Over the last 2 weeks, how often have you been bothered by the following problems? Feeling nervous, anxious or on edge: Not at all (0) Not being able to stop or control worrying: Not at all (0)} Score: 0 _________________________________________________________  Current Outpatient Medications  Medication Sig Dispense Refill   levonorgestrel  (MIRENA ) 20 MCG/24HR IUD 1 each by Intrauterine route  once.     levothyroxine  (SYNTHROID ) 112 MCG tablet Take 1 tablet (112 mcg total) by mouth daily. 60 tablet 2   Multiple Vitamin (MULTIVITAMIN) tablet Take 1 tablet by mouth daily.     mometasone  (ELOCON ) 0.1 % cream Apply 1 application topically as needed. (Patient not taking: Reported on 11/16/2023)     No current facility-administered medications for this visit.   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 - NEGATIVE FOR ATYPIA AND MALIGNANCY.   COLONOSCOPY WITH PROPOFOL  N/A 06/17/2023   Procedure: COLONOSCOPY WITH PROPOFOL ;  Surgeon: Therisa Bi, MD;  Location: [facility] ENDOSCOPY;  Service: Gastroenterology;  Laterality: N/A;   DILATION AND CURETTAGE OF UTERUS     INTRAUTERINE DEVICE (IUD) INSERTION  10/11/2014   KNEE ARTHROSCOPY     MELANOMA EXCISION  06/2014   both legs   POLYPECTOMY  06/17/2023   Procedure: POLYPECTOMY;  Surgeon: Therisa Bi, MD;  Location: [facility] ENDOSCOPY;  Service: Gastroenterology;;   SHELLEE TOOTH EXTRACTION      Review Of Systems  Constitutional: Denied constitutional symptoms, night sweats, recent illness, fatigue, fever, insomnia and weight loss.  Eyes: Denied eye symptoms, eye pain, photophobia, vision change and visual disturbance.  Ears/Nose/Throat/Neck: Denied ear, nose, throat or neck symptoms, hearing loss, nasal discharge, sinus congestion and sore throat.  Cardiovascular: Denied cardiovascular symptoms, arrhythmia, chest pain/pressure, edema, exercise intolerance, orthopnea and palpitations.  Respiratory: Denied pulmonary symptoms, asthma, pleuritic pain, productive sputum, cough, dyspnea and wheezing.  Gastrointestinal: Denied, gastro-esophageal reflux, melena, nausea and vomiting.  Genitourinary: Denied genitourinary symptoms including symptomatic vaginal discharge, pelvic relaxation issues, and urinary complaints.  Musculoskeletal: Denied musculoskeletal symptoms, stiffness, swelling, muscle weakness and myalgia.  Dermatologic: Denied dermatology symptoms, rash and scar.  Neurologic: Denied neurology symptoms, dizziness, headache, neck pain and syncope.  Psychiatric: Denied psychiatric symptoms, anxiety and depression.  Endocrine: Denied endocrine symptoms including hot flashes and night sweats.      Objective:    BP 110/70    Pulse 85   Ht 5' 6 (1.676 m)   Wt 129 lb (58.5 kg)   BMI 20.82 kg/m   Constitutional: Well-developed, well-nourished female in no acute distress Neurological: Alert and oriented to person, place, and time Psychiatric: Mood and affect appropriate Skin: No rashes or lesions Neck: Supple without masses. Trachea is midline.Thyroid  is normal size without masses Lymphatics: No cervical, axillary, supraclavicular, or inguinal adenopathy noted Respiratory: Clear to auscultation bilaterally. Good air movement with normal work of breathing. Cardiovascular: Regular rate and rhythm. Extremities grossly normal, nontender with no edema; pulses regular Gastrointestinal: Soft, nontender, nondistended. No masses or hernias appreciated. No hepatosplenomegaly. No fluid wave. No rebound or guarding. Breast Exam: normal appearance, no masses or tenderness, Inspection negative, No nipple retraction or dimpling, No nipple discharge or bleeding, No axillary or supraclavicular adenopathy, Normal to palpation without dominant masses Genitourinary:         External Genitalia: Normal female genitalia    Vagina: Normal mucosa, no lesions.    Cervix: No lesions, normal size and consistency; no cervical motion tenderness; non-friable; Pap not obtained.    Uterus: Normal size and contour; smooth, mobile, NT, anteverted. Adnexae: Non-palpable and non-tender Perineum/Anus: No lesions Rectal:  deferred    Assessment/Plan:    Tonya Estrada is a 48 y.o. female [phone] with normal well-woman gynecologic exam.  -Screenings:  Pap: cotesting due 2027 Mammogram: ordered Colon: per PCP, due in 88yrs Labs: per PCP GAD/PHQ-2 = 0 Contraception: Mirena  IUD, due for replacement in 2028 -Vaccines: UTD, Tdap today; pt declines Influenza. Gardasil: series not started, declined today.  -Healthy lifestyle modifications discussed: multivitamin, diet, exercise, sunscreen, tobacco and alcohol use. Emphasized importance of regular  physical activity.  -Folate and Calcium and Vit D recommendation reviewed.  -All questions answered to patient's satisfaction.     Return in about 1 year (around 11/15/2024) for Annual.    Estil Mangle, DO [facility]

## 2023-12-26 NOTE — Patient Instructions (Incomplete)
 Be Involved in Caring For Your Health:  Taking Medications When medications are taken as directed, they can greatly improve your health. But if they are not taken as prescribed, they may not work. In some cases, not taking them correctly can be harmful. To help ensure your treatment remains effective and safe, understand your medications and how to take them. Bring your medications to each visit for review by your provider.  Your lab results, notes, and after visit summary will be available on My Chart. We strongly encourage you to use this feature. If lab results are abnormal the clinic will contact you with the appropriate steps. If the clinic does not contact you assume the results are satisfactory. You can always view your results on My Chart. If you have questions regarding your health or results, please contact the clinic during office hours. You can also ask questions on My Chart.  We at [facility] are grateful that you chose [place] to provide your care. We strive to provide evidence-based and compassionate care and are always looking for feedback. If you get a survey from the clinic please complete this so we can hear your opinions.  Hypothyroidism  Hypothyroidism is when the thyroid gland does not make enough of certain hormones. This is called an underactive thyroid. The thyroid gland is a small gland located in the lower front part of the neck, just in front of the windpipe (trachea). This gland makes hormones that help control how the body uses food for energy (metabolism) as well as how the heart and brain function. These hormones also play a role in keeping your bones strong. When the thyroid is underactive, it produces too little of the hormones thyroxine (T4) and triiodothyronine (T3). What are the causes? This condition may be caused by: Hashimoto's disease. This is a disease in which the body's disease-fighting system (immune system) attacks the thyroid gland. This is the  most common cause. Viral infections. Pregnancy. Certain medicines. Birth defects. Problems with a gland in the center of the brain (pituitary gland). Lack of enough iodine in the diet. Other causes may include: Past radiation treatments to the head or neck for cancer. Past treatment with radioactive iodine. Past exposure to radiation in the environment. Past surgical removal of part or all of the thyroid. What increases the risk? You are more likely to develop this condition if: You are female. You have a family history of thyroid conditions. You use a medicine called lithium. You take medicines that affect the immune system (immunosuppressants). What are the signs or symptoms? Common symptoms of this condition include: Not being able to tolerate cold. Feeling as though you have no energy (lethargy). Lack of appetite. Constipation. Sadness or depression. Weight gain that is not explained by a change in diet or exercise habits. Menstrual irregularity. Dry skin, coarse hair, or brittle nails. Other symptoms may include: Muscle pain. Slowing of thought processes. Poor memory. How is this diagnosed? This condition may be diagnosed based on: Your symptoms, your medical history, and a physical exam. Blood tests. You may also have imaging tests, such as an ultrasound or MRI. How is this treated? This condition is treated with medicine that replaces the thyroid hormones that your body does not make. After you begin treatment, it may take several weeks for symptoms to go away. Follow these instructions at home: Take over-the-counter and prescription medicines only as told by your health care provider. If you start taking any new medicines, tell your health care  provider. Keep all follow-up visits as told by your health care provider. This is important. As your condition improves, your dosage of thyroid hormone medicine may change. You will need to have blood tests regularly so that  your health care provider can monitor your condition. Contact a health care provider if: Your symptoms do not get better with treatment. You are taking thyroid hormone replacement medicine and you: Sweat a lot. Have tremors. Feel anxious. Lose weight rapidly. Cannot tolerate heat. Have emotional swings. Have diarrhea. Feel weak. Get help right away if: You have chest pain. You have an irregular heartbeat. You have a rapid heartbeat. You have difficulty breathing. These symptoms may be an emergency. Get help right away. Call 911. Do not wait to see if the symptoms will go away. Do not drive yourself to the hospital. Summary Hypothyroidism is when the thyroid gland does not make enough of certain hormones (it is underactive). When the thyroid is underactive, it produces too little of the hormones thyroxine (T4) and triiodothyronine (T3). The most common cause is Hashimoto's disease, a disease in which the body's disease-fighting system (immune system) attacks the thyroid gland. The condition can also be caused by viral infections, medicine, pregnancy, or past radiation treatment to the head or neck. Symptoms may include weight gain, dry skin, constipation, feeling as though you do not have energy, and not being able to tolerate cold. This condition is treated with medicine to replace the thyroid hormones that your body does not make. This information is not intended to replace advice given to you by your health care provider. Make sure you discuss any questions you have with your health care provider. Document Revised: 10/28/2021 Document Reviewed: 10/28/2021 Elsevier Patient Education  2024 [employer].

## 2023-12-29 ENCOUNTER — Telehealth: Payer: Self-pay

## 2023-12-29 ENCOUNTER — Encounter: Payer: Self-pay | Admitting: Nurse Practitioner

## 2023-12-29 ENCOUNTER — Telehealth: Payer: 59 | Admitting: Nurse Practitioner

## 2023-12-29 DIAGNOSIS — E890 Postprocedural hypothyroidism: Secondary | ICD-10-CM

## 2023-12-29 NOTE — Patient Instructions (Signed)

## 2023-12-29 NOTE — Telephone Encounter (Signed)
 Copied from CRM [id number]. Topic: General - Other >> Dec 29, 2023 12:26 PM Santiya F wrote: Reason for CRM: Patient is calling in because she has concerns about the Copay. Patient says she was told by her insurance that her copay is supposed to be $10 and she says that her copay is going to be $25. Patient wants to know if she can pay the ten dollars and then be billed for the rest, per her insurance's suggestion. Patient is requesting a call back.

## 2023-12-29 NOTE — Progress Notes (Signed)
 There were no vitals taken for this visit.   Subjective:    Patient ID: Tonya Estrada, female    DOB: [birth year], 48 y.o.   MRN: [id number]  HPI: Tonya Estrada is a 48 y.o. female  Chief Complaint  Patient presents with   Hypothyroidism   Virtual Visit via Video Note  I connected with Tonya Estrada on 12/29/23 at  2:40 PM EST by a video enabled telemedicine application and verified that I am speaking with the correct person using two identifiers.  Location: Patient: home Provider: home   I discussed the limitations of evaluation and management by telemedicine and the availability of in person appointments. The patient expressed understanding and agreed to proceed.  I discussed the assessment and treatment plan with the patient. The patient was provided an opportunity to ask questions and all were answered. The patient agreed with the plan and demonstrated an understanding of the instructions.   The patient was advised to call back or seek an in-person evaluation if the symptoms worsen or if the condition fails to improve as anticipated.  I provided 25 minutes of non-face-to-face time during this encounter.   Marjie Skiff, NP   HYPOTHYROIDISM Taking Levothyroxine 112 MCG.  Was followed by endo, now PCP is taking over regimen. Last endo 02/03/23.  Thyroid control status:stable Satisfied with current treatment? yes Medication side effects: no Medication compliance: good compliance Etiology of hypothyroidism: postablative Recent dose adjustment:no Fatigue: no Cold intolerance: no Heat intolerance: no Weight gain: no Weight loss: no Constipation: no Diarrhea/loose stools: no Palpitations: no Lower extremity edema: no Anxiety/depressed mood: no     06/28/2023    4:09 PM 06/03/2023   10:17 AM 03/09/2023    8:23 AM 09/07/2022    1:26 PM 06/24/2022    3:22 PM  Depression screen PHQ 2/9  Decreased Interest 0 0 0 0 0  Down, Depressed, Hopeless 0 1 0 0 0  PHQ  - 2 Score 0 1 0 0 0  Altered sleeping 0 0 0 0   Tired, decreased energy 0 0 0    Change in appetite 0 0 0 0   Feeling bad or failure about yourself  0 0 0 0   Trouble concentrating 0 0 0 0   Moving slowly or fidgety/restless 0 0 0 0   Suicidal thoughts 0 0 0 0   PHQ-9 Score 0 1 0 0   Difficult doing work/chores Not difficult at all Not difficult at all Not difficult at all Not difficult at all        06/28/2023    4:09 PM 06/03/2023   10:17 AM 03/09/2023    8:23 AM 09/07/2022    1:26 PM  GAD 7 : Generalized Anxiety Score  Nervous, Anxious, on Edge 0 0 0 0  Control/stop worrying 0 0 0 0  Worry too much - different things 0 0 0 0  Trouble relaxing 0 0 0 0  Restless 0 0 0 0  Easily annoyed or irritable 0 0 0 0  Afraid - awful might happen 0 0 0 0  Total GAD 7 Score 0 0 0 0  Anxiety Difficulty Not difficult at all Not difficult at all Not difficult at all Not difficult at all   Relevant past medical, surgical, family and social history reviewed and updated as indicated. Interim medical history since our last visit reviewed. Allergies and medications reviewed and updated.  Review of Systems  Constitutional:  Negative for  activity change, appetite change, diaphoresis, fatigue and fever.  Respiratory:  Negative for cough, chest tightness, shortness of breath and wheezing.   Cardiovascular:  Negative for chest pain, palpitations and leg swelling.  Gastrointestinal: Negative.   Endocrine: Negative for cold intolerance and heat intolerance.  Neurological: Negative.   Psychiatric/Behavioral: Negative.      Per HPI unless specifically indicated above     Objective:    There were no vitals taken for this visit.  Wt Readings from Last 3 Encounters:  11/16/23 129 lb (58.5 kg)  06/28/23 128 lb 6.4 oz (58.2 kg)  06/17/23 122 lb 9.6 oz (55.6 kg)    Physical Exam Vitals and nursing note reviewed.  Constitutional:      General: She is awake. She is not in acute distress.     Appearance: She is well-developed. She is not ill-appearing.  HENT:     Head: Normocephalic.     Right Ear: Hearing normal.     Left Ear: Hearing normal.  Eyes:     General: Lids are normal.        Right eye: No discharge.        Left eye: No discharge.     Conjunctiva/sclera: Conjunctivae normal.  Pulmonary:     Effort: Pulmonary effort is normal. No accessory muscle usage or respiratory distress.  Musculoskeletal:     Cervical back: Normal range of motion.  Neurological:     Mental Status: She is alert and oriented to person, place, and time.  Psychiatric:        Attention and Perception: Attention normal.        Mood and Affect: Mood normal.        Behavior: Behavior normal. Behavior is cooperative.        Thought Content: Thought content normal.        Judgment: Judgment normal.     Results for orders placed or performed in visit on 09/20/23  TSH   Collection Time: 09/20/23  8:20 AM  Result Value Ref Range   TSH 0.643 0.450 - 4.500 uIU/mL  T4, free   Collection Time: 09/20/23  8:20 AM  Result Value Ref Range   Free T4 1.53 0.82 - 1.77 ng/dL      Assessment & Plan:   Problem List Items Addressed This Visit       Endocrine   Postablative hypothyroidism - Primary   Ongoing, ablation on 11/20/22 for hyperthyroid.  Currently taking Levothyroxine 112 MCG daily, will continue this regimen and adjust as needed.  PCP monitors labs and treatment at this time. Check labs outpatient.      Relevant Orders   T4, free   TSH     Follow up plan: Return in about 6 months (around 06/27/2024) for Annual Physical after 06/27/24.

## 2023-12-29 NOTE — Assessment & Plan Note (Signed)
 Ongoing, ablation on 11/20/22 for hyperthyroid.  Currently taking Levothyroxine 112 MCG daily, will continue this regimen and adjust as needed.  PCP monitors labs and treatment at this time. Check labs outpatient.

## 2023-12-30 ENCOUNTER — Other Ambulatory Visit: Payer: 59

## 2023-12-30 DIAGNOSIS — E890 Postprocedural hypothyroidism: Secondary | ICD-10-CM

## 2023-12-31 ENCOUNTER — Encounter: Payer: Self-pay | Admitting: Nurse Practitioner

## 2023-12-31 LAB — T4, FREE: Free T4: 1.62 ng/dL (ref 0.82–1.77)

## 2023-12-31 LAB — TSH: TSH: 0.952 u[IU]/mL (ref 0.450–4.500)

## 2023-12-31 NOTE — Progress Notes (Signed)
 Contacted via MyChart   Good morning Tonya Estrada, your thyroid labs returned and are nice and stable.  No changes needed.:)

## 2024-01-03 ENCOUNTER — Other Ambulatory Visit: Payer: Self-pay | Admitting: Nurse Practitioner

## 2024-01-04 NOTE — Telephone Encounter (Signed)
 Requested Prescriptions  Refused Prescriptions Disp Refills   levothyroxine (SYNTHROID) 112 MCG tablet [Pharmacy Med Name: LEVOTHYROXINE 0.112MG  ( ) TABS] 60 tablet 2    Sig: TAKE 1 TABLET(112 MCG) BY MOUTH DAILY     Endocrinology:  Hypothyroid Agents Passed - 01/04/2024 11:08 AM      Passed - TSH in normal range and within 360 days    TSH  Date Value Ref Range Status  12/30/2023 0.952 0.450 - 4.500 uIU/mL Final         Passed - Valid encounter within last 12 months    Recent Outpatient Visits           6 months ago Postablative hypothyroidism   [facility] [place], [place] T, NP   7 months ago Postablative hypothyroidism   [place] [facility] [place], [place] T, NP   10 months ago Postablative hypothyroidism   [place] [facility] [place], Corrie Dandy T, NP   1 year ago Hyperthyroidism   [place] [facility] [place], Corrie Dandy T, NP   1 year ago Heart palpitations   [place] [facility] [place], Dorie Rank, NP       Future Appointments             In 5 months [place], Dorie Rank, NP [place] [facility]

## 2024-01-13 MED ORDER — LEVOTHYROXINE SODIUM 112 MCG PO TABS: 112.0000 ug | ORAL_TABLET | Freq: Every day | ORAL | 2 refills | Status: DC

## 2024-02-29 ENCOUNTER — Encounter: Payer: Self-pay | Admitting: Nurse Practitioner

## 2024-03-08 ENCOUNTER — Telehealth: Payer: Self-pay

## 2024-03-08 DIAGNOSIS — Z20818 Contact with and (suspected) exposure to other bacterial communicable diseases: Secondary | ICD-10-CM

## 2024-03-08 MED ORDER — AZITHROMYCIN 250 MG PO TABS
ORAL_TABLET | ORAL | 0 refills | Status: AC
Start: 2024-03-08 — End: 2024-03-13

## 2024-03-08 NOTE — Telephone Encounter (Signed)
 Household contact to confirmed positive pertussis case. No symptoms. Consulted with MD is on levothyroxine  daily and allergy to hydrocodone. Called in Z pack per standing order for household contact/exposure to pertussis. Rx sent to [facility] in [place].

## 2024-03-14 ENCOUNTER — Ambulatory Visit
Admission: RE | Admit: 2024-03-14 | Discharge: 2024-03-14 | Disposition: A | Discharge status: Home / Self Care | Payer: 59 | Source: Ambulatory Visit | Attending: Obstetrics | Admitting: Obstetrics

## 2024-03-14 DIAGNOSIS — Z1231 Encounter for screening mammogram for malignant neoplasm of breast: Secondary | ICD-10-CM | POA: Insufficient documentation

## 2024-06-24 NOTE — Patient Instructions (Signed)
 Be Involved in Caring For Your Health:  Taking Medications When medications are taken as directed, they can greatly improve your health. But if they are not taken as prescribed, they may not work. In some cases, not taking them correctly can be harmful. To help ensure your treatment remains effective and safe, understand your medications and how to take them. Bring your medications to each visit for review by your provider.  Your lab results, notes, and after visit summary will be available on My Chart. We strongly encourage you to use this feature. If lab results are abnormal the clinic will contact you with the appropriate steps. If the clinic does not contact you assume the results are satisfactory. You can always view your results on My Chart. If you have questions regarding your health or results, please contact the clinic during office hours. You can also ask questions on My Chart.  We at [facility] are grateful that you chose us  to provide your care. We strive to provide evidence-based and compassionate care and are always looking for feedback. If you get a survey from the clinic please complete this so we can hear your opinions.  Healthy Eating, Adult Healthy eating may help you get and keep a healthy body weight, reduce the risk of chronic disease, and live a long and productive life. It is important to follow a healthy eating pattern. Your nutritional and calorie needs should be met mainly by different nutrient-rich foods. What are tips for following this plan? Reading food labels Read labels and choose the following: Reduced or low sodium products. Juices with 100% fruit juice. Foods with low saturated fats (<3 g per serving) and high polyunsaturated and monounsaturated fats. Foods with whole grains, such as whole wheat, cracked wheat, brown rice, and wild rice. Whole grains that are fortified with folic acid. This is recommended for females who are pregnant or who want to  become pregnant. Read labels and do not eat or drink the following: Foods or drinks with added sugars. These include foods that contain brown sugar, corn sweetener, corn syrup, dextrose , fructose, glucose, high-fructose corn syrup, honey, invert sugar, lactose, malt syrup, maltose, molasses, raw sugar, sucrose, trehalose, or turbinado sugar. Limit your intake of added sugars to less than 10% of your total daily calories. Do not eat more than the following amounts of added sugar per day: 6 teaspoons (25 g) for females. 9 teaspoons (38 g) for males. Foods that contain processed or refined starches and grains. Refined grain products, such as white flour, degermed cornmeal, white bread, and white rice. Shopping Choose nutrient-rich snacks, such as vegetables, whole fruits, and nuts. Avoid high-calorie and high-sugar snacks, such as potato chips, fruit snacks, and candy. Use oil-based dressings and spreads on foods instead of solid fats such as butter, margarine, sour cream, or cream cheese. Limit pre-made sauces, mixes, and instant products such as flavored rice, instant noodles, and ready-made pasta. Try more plant-protein sources, such as tofu, tempeh, black beans, edamame, lentils, nuts, and seeds. Explore eating plans such as the Mediterranean diet or vegetarian diet. Try heart-healthy dips made with beans and healthy fats like hummus and guacamole. Vegetables go great with these. Cooking Use oil to saut or stir-fry foods instead of solid fats such as butter, margarine, or lard. Try baking, boiling, grilling, or broiling instead of frying. Remove the fatty part of meats before cooking. Steam vegetables in water  or broth. Meal planning  At meals, imagine dividing your plate into fourths: One-half of  your plate is fruits and vegetables. One-fourth of your plate is whole grains. One-fourth of your plate is protein, especially lean meats, poultry, eggs, tofu, beans, or nuts. Include low-fat  dairy as part of your daily diet. Lifestyle Choose healthy options in all settings, including home, work, school, restaurants, or stores. Prepare your food safely: Wash your hands after handling raw meats. Where you prepare food, keep surfaces clean by regularly washing with hot, soapy water . Keep raw meats separate from ready-to-eat foods, such as fruits and vegetables. Cook seafood, meat, poultry, and eggs to the recommended temperature. Get a food thermometer. Store foods at safe temperatures. In general: Keep cold foods at 84F (4.4C) or below. Keep hot foods at 184F (60C) or above. Keep your freezer at [facility] (-17.8C) or below. Foods are not safe to eat if they have been between the temperatures of 40-184F (4.4-60C) for more than 2 hours. What foods should I eat? Fruits Aim to eat 1-2 cups of fresh, canned (in natural juice), or frozen fruits each day. One cup of fruit equals 1 small apple, 1 large banana, 8 large strawberries, 1 cup (237 g) canned fruit,  cup (82 g) dried fruit, or 1 cup (240 mL) 100% juice. Vegetables Aim to eat 2-4 cups of fresh and frozen vegetables each day, including different varieties and colors. One cup of vegetables equals 1 cup (91 g) broccoli or cauliflower florets, 2 medium carrots, 2 cups (150 g) raw, leafy greens, 1 large tomato, 1 large bell pepper, 1 large sweet potato, or 1 medium white potato. Grains Aim to eat 5-10 ounce-equivalents of whole grains each day. Examples of 1 ounce-equivalent of grains include 1 slice of bread, 1 cup (40 g) ready-to-eat cereal, 3 cups (24 g) popcorn, or  cup (93 g) cooked rice. Meats and other proteins Try to eat 5-7 ounce-equivalents of protein each day. Examples of 1 ounce-equivalent of protein include 1 egg,  oz nuts (12 almonds, 24 pistachios, or 7 walnut halves), 1/4 cup (90 g) cooked beans, 6 tablespoons (90 g) hummus or 1 tablespoon (16 g) peanut butter. A cut of meat or fish that is the size of a deck of  cards is about 3-4 ounce-equivalents (85 g). Of the protein you eat each week, try to have at least 8 sounce (227 g) of seafood. This is about 2 servings per week. This includes salmon, trout, herring, sardines, and anchovies. Dairy Aim to eat 3 cup-equivalents of fat-free or low-fat dairy each day. Examples of 1 cup-equivalent of dairy include 1 cup (240 mL) milk, 8 ounces (250 g) yogurt, 1 ounces (44 g) natural cheese, or 1 cup (240 mL) fortified soy milk. Fats and oils Aim for about 5 teaspoons (21 g) of fats and oils per day. Choose monounsaturated fats, such as canola and olive oils, mayonnaise made with olive oil or avocado oil, avocados, peanut butter, and most nuts, or polyunsaturated fats, such as sunflower, corn, and soybean oils, walnuts, pine nuts, sesame seeds, sunflower seeds, and flaxseed. Beverages Aim for 6 eight-ounce glasses of water  per day. Limit coffee to 3-5 eight-ounce cups per day. Limit caffeinated beverages that have added calories, such as soda and energy drinks. If you drink alcohol: Limit how much you have to: 0-1 drink a day if you are female. 0-2 drinks a day if you are female. Know how much alcohol is in your drink. In the [place] one drink is one 12 oz bottle of beer (355 mL), one 5 oz glass of wine (  148 mL), or one 1 oz glass of hard liquor (44 mL). Seasoning and other foods Try not to add too much salt to your food. Try using herbs and spices instead of salt. Try not to add sugar to food. This information is based on [place] nutrition guidelines. To learn more, visit DisposableN[employer].be. Exact amounts may vary. You may need different amounts. This information is not intended to replace advice given to you by your health care provider. Make sure you discuss any questions you have with your health care provider. Document Revised: 07/27/2022 Document Reviewed: 07/27/2022 Elsevier Patient Education  2024 [employer].

## 2024-06-28 ENCOUNTER — Ambulatory Visit (INDEPENDENT_AMBULATORY_CARE_PROVIDER_SITE_OTHER): Payer: 59 | Admitting: Nurse Practitioner

## 2024-06-28 ENCOUNTER — Encounter: Payer: Self-pay | Admitting: Nurse Practitioner

## 2024-06-28 VITALS — BP 119/77 | HR 69 | Temp 97.8°F | Ht 66.0 in | Wt 131.6 lb

## 2024-06-28 DIAGNOSIS — Z85820 Personal history of malignant melanoma of skin: Secondary | ICD-10-CM | POA: Diagnosis not present

## 2024-06-28 DIAGNOSIS — Z13220 Encounter for screening for lipoid disorders: Secondary | ICD-10-CM

## 2024-06-28 DIAGNOSIS — Z136 Encounter for screening for cardiovascular disorders: Secondary | ICD-10-CM

## 2024-06-28 DIAGNOSIS — E890 Postprocedural hypothyroidism: Secondary | ICD-10-CM | POA: Diagnosis not present

## 2024-06-28 DIAGNOSIS — R002 Palpitations: Secondary | ICD-10-CM

## 2024-06-28 DIAGNOSIS — Z0000 Encounter for general adult medical examination without abnormal findings: Secondary | ICD-10-CM

## 2024-06-28 NOTE — Assessment & Plan Note (Signed)
 Ongoing, ablation on 11/20/22 for hyperthyroid.  Currently taking Levothyroxine  112 MCG daily, will continue this regimen and adjust as needed.  PCP monitors labs and treatment at this time. Check labs today.

## 2024-06-28 NOTE — Assessment & Plan Note (Signed)
 No changes, asymptomatic. Obtain cardiology referral if increased episodes or other symptoms present.  Is aware to schedule appointment if any changes present.

## 2024-06-28 NOTE — Assessment & Plan Note (Signed)
Continue annual skin exams with dermatology.

## 2024-06-28 NOTE — Progress Notes (Signed)
 BP 119/77   Pulse 69   Temp 97.8 F (36.6 C) (Oral)   Ht 5' 6 (1.676 m)   Wt 131 lb 9.6 oz (59.7 kg)   SpO2 98%   BMI 21.24 kg/m    Subjective:    Patient ID: Tonya Estrada, female    DOB: 04/05/1976, 48 y.o.   MRN: [id number]  HPI: MACIAH FEEBACK is a 48 y.o. female presenting on 06/28/2024 for comprehensive medical examination. Current medical complaints include:none  She currently lives with: significant other Menopausal Symptoms: no  HYPOTHYROIDISM Currently taking Levothyroxine  112 MCG daily. Annual skin exams due to history of melanoma.   Thyroid  control status:stable Satisfied with current treatment? yes Medication side effects: no Medication compliance: good compliance Etiology of hypothyroidism: postablative  Recent dose adjustment: yes Fatigue: no Cold intolerance: yes Heat intolerance: no Weight gain: no Weight loss: no Constipation: no Diarrhea/loose stools: no Palpitations: one time several months ago Lower extremity edema: no Anxiety/depressed mood: no   Depression Screen done today and results listed below:     06/28/2024    4:15 PM 06/28/2023    4:09 PM 06/03/2023   10:17 AM 03/09/2023    8:23 AM 09/07/2022    1:26 PM  Depression screen PHQ 2/9  Decreased Interest 0 0 0 0 0  Down, Depressed, Hopeless 0 0 1 0 0  PHQ - 2 Score 0 0 1 0 0  Altered sleeping 0 0 0 0 0  Tired, decreased energy 0 0 0 0   Change in appetite 0 0 0 0 0  Feeling bad or failure about yourself  0 0 0 0 0  Trouble concentrating 0 0 0 0 0  Moving slowly or fidgety/restless 0 0 0 0 0  Suicidal thoughts 0 0 0 0 0  PHQ-9 Score 0 0 1 0 0  Difficult doing work/chores Not difficult at all Not difficult at all Not difficult at all Not difficult at all Not difficult at all      06/28/2024    4:16 PM 06/28/2023    4:09 PM 06/03/2023   10:17 AM 03/09/2023    8:23 AM  GAD 7 : Generalized Anxiety Score  Nervous, Anxious, on Edge 0 0 0 0  Control/stop worrying 0 0 0 0  Worry  too much - different things 0 0 0 0  Trouble relaxing 0 0 0 0  Restless 0 0 0 0  Easily annoyed or irritable 0 0 0 0  Afraid - awful might happen 0 0 0 0  Total GAD 7 Score 0 0 0 0  Anxiety Difficulty Not difficult at all Not difficult at all Not difficult at all Not difficult at all      09/07/2022    1:26 PM 03/09/2023    8:23 AM 06/03/2023   10:17 AM 06/28/2023    4:08 PM 06/28/2024    4:15 PM  Fall Risk  Falls in the past year? 0 0 0 0 0  Was there an injury with Fall? 0 0 0 0 0  Fall Risk Category Calculator 0 0 0 0 0  Fall Risk Category (Retired) Low       (RETIRED) Patient Fall Risk Level Low fall risk       Patient at Risk for Falls Due to No Fall Risks No Fall Risks No Fall Risks No Fall Risks No Fall Risks  Fall risk Follow up Falls evaluation completed  Falls evaluation completed Falls evaluation completed Falls evaluation  completed Falls evaluation completed     Data saved with a previous flowsheet row definition    Past Medical History:  Past Medical History:  Diagnosis Date   Allergy    Cancer (HCC)    melanoma   Thyroid  disease     Surgical History:  Past Surgical History:  Procedure Laterality Date   breast biopsy Left 09/07/2022   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 - NEGATIVE FOR ATYPIA AND MALIGNANCY.   COLONOSCOPY WITH PROPOFOL  N/A 06/17/2023   Procedure: COLONOSCOPY WITH PROPOFOL ;  Surgeon: Therisa Bi, MD;  Location: [facility] ENDOSCOPY;  Service: Gastroenterology;  Laterality: N/A;   DILATION AND CURETTAGE OF UTERUS     INTRAUTERINE DEVICE (IUD) INSERTION  10/11/2014   KNEE ARTHROSCOPY     MELANOMA EXCISION  06/2014   both legs   POLYPECTOMY  06/17/2023   Procedure: POLYPECTOMY;  Surgeon: Therisa Bi, MD;  Location: [facility] ENDOSCOPY;  Service: Gastroenterology;;   SHELLEE TOOTH EXTRACTION      Medications:  Current Outpatient Medications on File Prior to Visit  Medication Sig   levonorgestrel   (MIRENA ) 20 MCG/24HR IUD 1 each by Intrauterine route once.   levothyroxine  (SYNTHROID ) 112 MCG tablet Take 1 tablet (112 mcg total) by mouth daily.   mometasone  (ELOCON ) 0.1 % cream Apply 1 application  topically as needed.   Multiple Vitamin (MULTIVITAMIN) tablet Take 1 tablet by mouth daily.   No current facility-administered medications on file prior to visit.    Allergies:  Allergies  Allergen Reactions   Hydrocodone Hives    Social History:  Social History   Socioeconomic History   Marital status: Married    Spouse name: Not on file   Number of children: 2   Years of education: Not on file   Highest education level: Master's degree (e.g., MA, MS, MEng, MEd, MSW, MBA)  Occupational History   Occupation: Teacher  Tobacco Use   Smoking status: Never   Smokeless tobacco: Never  Vaping Use   Vaping status: Never Used  Substance and Sexual Activity   Alcohol use: Yes    Alcohol/week: 5.0 standard drinks of alcohol    Types: 5 Glasses of wine per week   Drug use: No   Sexual activity: Yes    Partners: Male    Birth control/protection: I.U.D.    Comment: Mirena   Other Topics Concern   Not on file  Social History Narrative   Not on file   Social Drivers of Health   Financial Resource Strain: Low Risk  (06/26/2024)   Overall Financial Resource Strain (CARDIA)    Difficulty of Paying Living Expenses: Not hard at all  Food Insecurity: No Food Insecurity (06/26/2024)   Hunger Vital Sign    Worried About Running Out of Food in the Last Year: Never true    Ran Out of Food in the Last Year: Never true  Transportation Needs: No Transportation Needs (06/26/2024)   PRAPARE - Administrator, Civil Service (Medical): No    Lack of Transportation (Non-Medical): No  Physical Activity: Insufficiently Active (06/26/2024)   Exercise Vital Sign    Days of Exercise per Week: 3 days    Minutes of Exercise per Session: 10 min  Stress: No Stress Concern Present (06/26/2024)    [employer] of Occupational Health - Occupational Stress Questionnaire    Feeling of Stress: Not at all  Social Connections: Socially Integrated (06/26/2024)   Social Connection and Isolation Panel  Frequency of Communication with Friends and Family: Three times a week    Frequency of Social Gatherings with Friends and Family: Twice a week    Attends Religious Services: More than 4 times per year    Active Member of [employer] or Organizations: Yes    Attends Engineer, structural: More than 4 times per year    Marital Status: Married  Catering manager Violence: Not At Risk (06/28/2024)   Humiliation, Afraid, Rape, and Kick questionnaire    Fear of Current or Ex-Partner: No    Emotionally Abused: No    Physically Abused: No    Sexually Abused: No   Social History   Tobacco Use  Smoking Status Never  Smokeless Tobacco Never   Social History   Substance and Sexual Activity  Alcohol Use Yes   Alcohol/week: 5.0 standard drinks of alcohol   Types: 5 Glasses of wine per week    Family History:  Family History  Problem Relation Age of Onset   Rheum arthritis Mother    Arthritis Mother    Basal cell carcinoma Father 46   Varicose Veins Father    Hypertension Father    Other Sister 18       Radial sclerosing lesions (RSLs)   Multiple myeloma Maternal Grandmother    Heart disease Maternal Grandmother        MI   Hypertension Maternal Grandmother    Diabetes Maternal Grandfather        type 1   Heart disease Maternal Grandfather    Hearing loss Maternal Grandfather    Varicose Veins Paternal Grandmother    Cancer Paternal Grandfather 36       brain   Early death Paternal Grandfather    Breast cancer Other 70   Hearing loss Maternal Aunt     Past medical history, surgical history, medications, allergies, family history and social history reviewed with patient today and changes made to appropriate areas of the chart.   ROS All other ROS negative except what  is listed above and in the HPI.      Objective:    BP 119/77   Pulse 69   Temp 97.8 F (36.6 C) (Oral)   Ht 5' 6 (1.676 m)   Wt 131 lb 9.6 oz (59.7 kg)   SpO2 98%   BMI 21.24 kg/m   Wt Readings from Last 3 Encounters:  06/28/24 131 lb 9.6 oz (59.7 kg)  11/16/23 129 lb (58.5 kg)  06/28/23 128 lb 6.4 oz (58.2 kg)    Physical Exam Vitals and nursing note reviewed. Exam conducted with a chaperone present.  Constitutional:      General: She is awake. She is not in acute distress.    Appearance: She is well-developed and well-groomed. She is not ill-appearing or toxic-appearing.  HENT:     Head: Normocephalic and atraumatic.     Right Ear: Hearing, tympanic membrane, ear canal and external ear normal. No drainage.     Left Ear: Hearing, tympanic membrane, ear canal and external ear normal. No drainage.     Nose: Nose normal.     Right Sinus: No maxillary sinus tenderness or frontal sinus tenderness.     Left Sinus: No maxillary sinus tenderness or frontal sinus tenderness.     Mouth/Throat:     Mouth: Mucous membranes are moist.     Pharynx: Oropharynx is clear. Uvula midline. No pharyngeal swelling, oropharyngeal exudate or posterior oropharyngeal erythema.  Eyes:     General:  Lids are normal.        Right eye: No discharge.        Left eye: No discharge.     Extraocular Movements: Extraocular movements intact.     Conjunctiva/sclera: Conjunctivae normal.     Pupils: Pupils are equal, round, and reactive to light.     Visual Fields: Right eye visual fields normal and left eye visual fields normal.  Neck:     Thyroid : No thyromegaly.     Vascular: No carotid bruit.     Trachea: Trachea normal.  Cardiovascular:     Rate and Rhythm: Normal rate and regular rhythm.     Heart sounds: Normal heart sounds. No murmur heard.    No gallop.  Pulmonary:     Effort: Pulmonary effort is normal. No accessory muscle usage or respiratory distress.     Breath sounds: Normal breath  sounds.  Chest:  Breasts:    Right: Normal.     Left: Normal.  Abdominal:     General: Bowel sounds are normal.     Palpations: Abdomen is soft. There is no hepatomegaly or splenomegaly.     Tenderness: There is no abdominal tenderness.  Musculoskeletal:        General: Normal range of motion.     Cervical back: Normal range of motion and neck supple.     Right lower leg: No edema.     Left lower leg: No edema.  Lymphadenopathy:     Head:     Right side of head: No submental, submandibular, tonsillar, preauricular or posterior auricular adenopathy.     Left side of head: No submental, submandibular, tonsillar, preauricular or posterior auricular adenopathy.     Cervical: No cervical adenopathy.     Upper Body:     Right upper body: No supraclavicular, axillary or pectoral adenopathy.     Left upper body: No supraclavicular, axillary or pectoral adenopathy.  Skin:    General: Skin is warm and dry.     Capillary Refill: Capillary refill takes less than 2 seconds.     Findings: No rash.  Neurological:     Mental Status: She is alert and oriented to person, place, and time.     Gait: Gait is intact.     Deep Tendon Reflexes: Reflexes are normal and symmetric.     Reflex Scores:      Brachioradialis reflexes are 2+ on the right side and 2+ on the left side.      Patellar reflexes are 2+ on the right side and 2+ on the left side. Psychiatric:        Attention and Perception: Attention normal.        Mood and Affect: Mood normal.        Speech: Speech normal.        Behavior: Behavior normal. Behavior is cooperative.        Thought Content: Thought content normal.        Judgment: Judgment normal.    Results for orders placed or performed in visit on 12/30/23  TSH   Collection Time: 12/30/23  3:46 PM  Result Value Ref Range   TSH 0.952 0.450 - 4.500 uIU/mL  T4, free   Collection Time: 12/30/23  3:46 PM  Result Value Ref Range   Free T4 1.62 0.82 - 1.77 ng/dL       Assessment & Plan:   Problem List Items Addressed This Visit       Endocrine  Postablative hypothyroidism - Primary   Ongoing, ablation on 11/20/22 for hyperthyroid.  Currently taking Levothyroxine  112 MCG daily, will continue this regimen and adjust as needed.  PCP monitors labs and treatment at this time. Check labs today.      Relevant Orders   T4, free   TSH     Other   History of melanoma   Continue annual skin exams with dermatology.      Heart palpitations   No changes, asymptomatic. Obtain cardiology referral if increased episodes or other symptoms present.  Is aware to schedule appointment if any changes present.      Relevant Orders   CBC with Differential/Platelet   Comprehensive metabolic panel with GFR   Lipid Panel w/o Chol/HDL Ratio   Other Visit Diagnoses       Encounter for lipid screening for cardiovascular disease       Lipd panel on labs today.   Relevant Orders   Comprehensive metabolic panel with GFR   Lipid Panel w/o Chol/HDL Ratio     Encounter for annual physical exam       Annual physical today and health maintenance reviewed.        Follow up plan: Return in about 1 year (around 06/28/2025) for Annual Physical.   LABORATORY TESTING:  - Pap smear: up to date  IMMUNIZATIONS:   - Tdap: Tetanus vaccination status reviewed: last tetanus booster within 10 years. - Influenza: Up to date - Pneumovax: Not applicable - Prevnar: Not applicable - COVID: Up to date - HPV: Not applicable - Shingrix vaccine: Not applicable  SCREENING: -Mammogram: Up to date  - Colonoscopy: Up to date  - Bone Density: Not applicable  -Hearing Test: Not applicable  -Spirometry: Not applicable   PATIENT COUNSELING:   Advised to take 1 mg of folate supplement per day if capable of pregnancy.   Sexuality: Discussed sexually transmitted diseases, partner selection, use of condoms, avoidance of unintended pregnancy  and contraceptive alternatives.   Advised  to avoid cigarette smoking.  I discussed with the patient that most people either abstain from alcohol or drink within safe limits (<=14/week and <=4 drinks/occasion for males, <=7/weeks and <= 3 drinks/occasion for females) and that the risk for alcohol disorders and other health effects rises proportionally with the number of drinks per week and how often a drinker exceeds daily limits.  Discussed cessation/primary prevention of drug use and availability of treatment for abuse.   Diet: Encouraged to adjust caloric intake to maintain  or achieve ideal body weight, to reduce intake of dietary saturated fat and total fat, to limit sodium intake by avoiding high sodium foods and not adding table salt, and to maintain adequate dietary potassium and calcium preferably from fresh fruits, vegetables, and low-fat dairy products.    Stressed the importance of regular exercise  Injury prevention: Discussed safety belts, safety helmets, smoke detector, smoking near bedding or upholstery.   Dental health: Discussed importance of regular tooth brushing, flossing, and dental visits.    NEXT PREVENTATIVE PHYSICAL DUE IN 1 YEAR. Return in about 1 year (around 06/28/2025) for Annual Physical.

## 2024-06-29 ENCOUNTER — Ambulatory Visit: Payer: Self-pay | Admitting: Nurse Practitioner

## 2024-06-29 DIAGNOSIS — E890 Postprocedural hypothyroidism: Secondary | ICD-10-CM

## 2024-06-29 LAB — LIPID PANEL W/O CHOL/HDL RATIO
Cholesterol, Total: 154 mg/dL (ref 100–199)
HDL: 57 mg/dL (ref >39)
LDL Chol Calc (NIH): 78 mg/dL (ref 0–99)
Triglycerides: 106 mg/dL (ref 0–149)
VLDL Cholesterol Cal: 19 mg/dL (ref 5–40)

## 2024-06-29 LAB — CBC WITH DIFFERENTIAL/PLATELET
Basophils Absolute: 0.1 x10E3/uL (ref 0.0–0.2)
Basos: 1 %
EOS (ABSOLUTE): 0.1 x10E3/uL (ref 0.0–0.4)
Eos: 1 %
Hematocrit: 39.4 % (ref 34.0–46.6)
Hemoglobin: 12.8 g/dL (ref 11.1–15.9)
Immature Grans (Abs): 0 x10E3/uL (ref 0.0–0.1)
Immature Granulocytes: 0 %
Lymphocytes Absolute: 1.5 x10E3/uL (ref 0.7–3.1)
Lymphs: 19 %
MCH: 29.7 pg (ref 26.6–33.0)
MCHC: 32.5 g/dL (ref 31.5–35.7)
MCV: 91 fL (ref 79–97)
Monocytes Absolute: 0.5 x10E3/uL (ref 0.1–0.9)
Monocytes: 6 %
Neutrophils Absolute: 5.7 x10E3/uL (ref 1.4–7.0)
Neutrophils: 73 %
Platelets: 231 x10E3/uL (ref 150–450)
RBC: 4.31 x10E6/uL (ref 3.77–5.28)
RDW: 12 % (ref 11.7–15.4)
WBC: 7.9 x10E3/uL (ref 3.4–10.8)

## 2024-06-29 LAB — COMPREHENSIVE METABOLIC PANEL WITH GFR
ALT: 8 IU/L (ref 0–32)
AST: 13 IU/L (ref 0–40)
Albumin: 4.4 g/dL (ref 3.9–4.9)
Alkaline Phosphatase: 61 IU/L (ref 44–121)
BUN/Creatinine Ratio: 21 (ref 9–23)
BUN: 13 mg/dL (ref 6–24)
Bilirubin Total: 0.3 mg/dL (ref 0.0–1.2)
CO2: 23 mmol/L (ref 20–29)
Calcium: 9.1 mg/dL (ref 8.7–10.2)
Chloride: 104 mmol/L (ref 96–106)
Creatinine, Ser: 0.61 mg/dL (ref 0.57–1.00)
Globulin, Total: 2.5 g/dL (ref 1.5–4.5)
Glucose: 97 mg/dL (ref 70–99)
Potassium: 4.4 mmol/L (ref 3.5–5.2)
Sodium: 138 mmol/L (ref 134–144)
Total Protein: 6.9 g/dL (ref 6.0–8.5)
eGFR: 111 mL/min/1.73 (ref >59)

## 2024-06-29 LAB — TSH: TSH: 0.224 u[IU]/mL — ABNORMAL LOW (ref 0.450–4.500)

## 2024-06-29 LAB — T4, FREE: Free T4: 1.58 ng/dL (ref 0.82–1.77)

## 2024-06-29 NOTE — Progress Notes (Signed)
 Called patient and left a message for her to call back to get scheduled for a 2 week lab appt.

## 2024-07-14 ENCOUNTER — Other Ambulatory Visit

## 2024-07-17 ENCOUNTER — Other Ambulatory Visit

## 2024-07-17 DIAGNOSIS — E890 Postprocedural hypothyroidism: Secondary | ICD-10-CM

## 2024-07-18 ENCOUNTER — Ambulatory Visit: Payer: Self-pay | Admitting: Nurse Practitioner

## 2024-07-18 DIAGNOSIS — E890 Postprocedural hypothyroidism: Secondary | ICD-10-CM

## 2024-07-18 LAB — TSH: TSH: 0.193 u[IU]/mL — ABNORMAL LOW (ref 0.450–4.500)

## 2024-07-18 LAB — T4, FREE: Free T4: 1.74 ng/dL (ref 0.82–1.77)

## 2024-07-18 MED ORDER — LEVOTHYROXINE SODIUM 100 MCG PO TABS: 100.0000 ug | ORAL_TABLET | Freq: Every day | ORAL | 3 refills | Status: DC

## 2024-07-18 NOTE — Progress Notes (Signed)
 Called patient and left a message for her to call back to get scheduled for labs in 6 weeks

## 2024-07-19 NOTE — Progress Notes (Signed)
 Svheduled

## 2024-08-28 ENCOUNTER — Other Ambulatory Visit

## 2024-08-28 DIAGNOSIS — E890 Postprocedural hypothyroidism: Secondary | ICD-10-CM

## 2024-08-29 ENCOUNTER — Ambulatory Visit: Payer: Self-pay | Admitting: Nurse Practitioner

## 2024-08-29 LAB — TSH: TSH: 0.21 u[IU]/mL — ABNORMAL LOW (ref 0.450–4.500)

## 2024-08-29 LAB — T4, FREE: Free T4: 1.55 ng/dL (ref 0.82–1.77)

## 2024-08-29 MED ORDER — LEVOTHYROXINE SODIUM 88 MCG PO TABS: 88.0000 ug | ORAL_TABLET | Freq: Every day | ORAL | 3 refills | Status: AC

## 2024-08-30 ENCOUNTER — Other Ambulatory Visit

## 2024-08-30 NOTE — Progress Notes (Signed)
 Called patient and left a message for her to call back to get scheduled fro labs

## 2024-09-01 NOTE — Progress Notes (Signed)
 Called patient and left a message for her to call back to get scheduled for labs in about 6 weeks.

## 2024-10-09 ENCOUNTER — Other Ambulatory Visit

## 2024-10-09 DIAGNOSIS — E890 Postprocedural hypothyroidism: Secondary | ICD-10-CM

## 2024-10-10 ENCOUNTER — Ambulatory Visit: Payer: Self-pay | Admitting: Nurse Practitioner

## 2024-10-10 LAB — TSH: TSH: 1.37 u[IU]/mL (ref 0.450–4.500)

## 2024-10-10 LAB — T4, FREE: Free T4: 1.24 ng/dL (ref 0.82–1.77)

## 2024-10-10 NOTE — Progress Notes (Signed)
 Contacted via MyChart  Good morning Tonya Estrada, I think we found a happy spot. Your thyroid  levels are normal at this time. Continue current Levothyroxine  dose. Any questions? Keep being amazing!!  Thank you for allowing me to participate in your care.  I appreciate you. Kindest regards, Kern Gingras

## 2024-11-01 ENCOUNTER — Encounter

## 2024-11-08 ENCOUNTER — Other Ambulatory Visit: Payer: Self-pay | Admitting: Nurse Practitioner

## 2025-07-03 ENCOUNTER — Encounter: Admitting: Nurse Practitioner
